# Patient Record
Sex: Male | Born: 1960 | Race: White | Hispanic: No | Marital: Single | Smoking: Current every day smoker
Health system: Southern US, Community
[De-identification: ages and names within clinical notes are randomized; demographics above are authoritative.]

## PROBLEM LIST (undated history)

## (undated) DIAGNOSIS — J449 Chronic obstructive pulmonary disease, unspecified: Secondary | ICD-10-CM

## (undated) DIAGNOSIS — F17200 Nicotine dependence, unspecified, uncomplicated: Secondary | ICD-10-CM

## (undated) DIAGNOSIS — I10 Essential (primary) hypertension: Secondary | ICD-10-CM

## (undated) HISTORY — DX: Essential (primary) hypertension: I10

## (undated) HISTORY — DX: Chronic obstructive pulmonary disease, unspecified: J44.9

## (undated) HISTORY — DX: Nicotine dependence, unspecified, uncomplicated: F17.200

---

## 2002-05-20 ENCOUNTER — Emergency Department (HOSPITAL_COMMUNITY): Admission: EM | Admit: 2002-05-20 | Discharge: 2002-05-20 | Payer: Self-pay | Admitting: Emergency Medicine

## 2011-05-06 ENCOUNTER — Emergency Department (HOSPITAL_COMMUNITY)
Admission: EM | Admit: 2011-05-06 | Discharge: 2011-05-06 | Disposition: A | Discharge status: Home / Self Care | Payer: Self-pay | Attending: Emergency Medicine | Admitting: Emergency Medicine

## 2011-05-06 ENCOUNTER — Other Ambulatory Visit: Payer: Self-pay

## 2011-05-06 ENCOUNTER — Encounter: Payer: Self-pay | Admitting: *Deleted

## 2011-05-06 ENCOUNTER — Emergency Department (HOSPITAL_COMMUNITY): Payer: Self-pay

## 2011-05-06 DIAGNOSIS — J4489 Other specified chronic obstructive pulmonary disease: Secondary | ICD-10-CM | POA: Insufficient documentation

## 2011-05-06 DIAGNOSIS — J449 Chronic obstructive pulmonary disease, unspecified: Secondary | ICD-10-CM | POA: Insufficient documentation

## 2011-05-06 DIAGNOSIS — R05 Cough: Secondary | ICD-10-CM | POA: Insufficient documentation

## 2011-05-06 DIAGNOSIS — J40 Bronchitis, not specified as acute or chronic: Secondary | ICD-10-CM | POA: Insufficient documentation

## 2011-05-06 DIAGNOSIS — R062 Wheezing: Secondary | ICD-10-CM | POA: Insufficient documentation

## 2011-05-06 DIAGNOSIS — F17200 Nicotine dependence, unspecified, uncomplicated: Secondary | ICD-10-CM | POA: Insufficient documentation

## 2011-05-06 DIAGNOSIS — R059 Cough, unspecified: Secondary | ICD-10-CM | POA: Insufficient documentation

## 2011-05-06 DIAGNOSIS — R079 Chest pain, unspecified: Secondary | ICD-10-CM | POA: Insufficient documentation

## 2011-05-06 DIAGNOSIS — R509 Fever, unspecified: Secondary | ICD-10-CM | POA: Insufficient documentation

## 2011-05-06 LAB — DIFFERENTIAL
Basophils Relative: 0 % (ref 0–1)
Eosinophils Absolute: 0 10*3/uL (ref 0.0–0.7)
Eosinophils Relative: 0 % (ref 0–5)
Lymphs Abs: 1.1 10*3/uL (ref 0.7–4.0)
Monocytes Relative: 13 % — ABNORMAL HIGH (ref 3–12)
Neutrophils Relative %: 76 % (ref 43–77)

## 2011-05-06 LAB — POCT I-STAT, CHEM 8
Calcium, Ion: 1.07 mmol/L — ABNORMAL LOW (ref 1.12–1.32)
Creatinine, Ser: 1 mg/dL (ref 0.50–1.35)
Glucose, Bld: 135 mg/dL — ABNORMAL HIGH (ref 70–99)
HCT: 47 % (ref 39.0–52.0)
Hemoglobin: 16 g/dL (ref 13.0–17.0)
Potassium: 4 mEq/L (ref 3.5–5.1)
TCO2: 26 mmol/L (ref 0–100)

## 2011-05-06 LAB — CBC
Hemoglobin: 15.6 g/dL (ref 13.0–17.0)
MCH: 31.6 pg (ref 26.0–34.0)
MCHC: 34.4 g/dL (ref 30.0–36.0)
MCV: 92.1 fL (ref 78.0–100.0)

## 2011-05-06 MED ORDER — ALBUTEROL SULFATE HFA 108 (90 BASE) MCG/ACT IN AERS: 1.0000 | INHALATION_SPRAY | Freq: Four times a day (QID) | RESPIRATORY_TRACT | Status: DC | PRN

## 2011-05-06 MED ORDER — IPRATROPIUM BROMIDE 0.02 % IN SOLN
0.5000 mg | Freq: Once | RESPIRATORY_TRACT | Status: DC
  Filled 2011-05-06: qty 2.5

## 2011-05-06 MED ORDER — METHYLPREDNISOLONE SODIUM SUCC 125 MG IJ SOLR
125.0000 mg | Freq: Once | INTRAMUSCULAR | Status: AC
  Administered 2011-05-06: 125 mg via INTRAVENOUS
  Filled 2011-05-06: qty 2

## 2011-05-06 MED ORDER — ALBUTEROL SULFATE (5 MG/ML) 0.5% IN NEBU: 5.0000 mg | INHALATION_SOLUTION | Freq: Once | RESPIRATORY_TRACT | Status: DC

## 2011-05-06 MED ORDER — IPRATROPIUM BROMIDE 0.02 % IN SOLN
0.5000 mg | Freq: Once | RESPIRATORY_TRACT | Status: AC
  Administered 2011-05-06: 0.5 mg via RESPIRATORY_TRACT

## 2011-05-06 MED ORDER — ALBUTEROL SULFATE (5 MG/ML) 0.5% IN NEBU
5.0000 mg | INHALATION_SOLUTION | Freq: Once | RESPIRATORY_TRACT | Status: AC
  Administered 2011-05-06: 5 mg via RESPIRATORY_TRACT
  Filled 2011-05-06: qty 1

## 2011-05-06 MED ORDER — DOXYCYCLINE HYCLATE 100 MG PO TABS
100.0000 mg | ORAL_TABLET | Freq: Once | ORAL | Status: AC
  Administered 2011-05-06: 100 mg via ORAL
  Filled 2011-05-06: qty 1

## 2011-05-06 MED ORDER — PREDNISONE 50 MG PO TABS: 50.0000 mg | ORAL_TABLET | Freq: Every day | ORAL | Status: DC

## 2011-05-06 MED ORDER — DOXYCYCLINE HYCLATE 100 MG PO CAPS: 100.0000 mg | ORAL_CAPSULE | Freq: Two times a day (BID) | ORAL | Status: AC

## 2011-05-06 MED ORDER — KETOROLAC TROMETHAMINE 30 MG/ML IJ SOLN
30.0000 mg | Freq: Once | INTRAMUSCULAR | Status: AC
  Administered 2011-05-06: 30 mg via INTRAVENOUS
  Filled 2011-05-06: qty 1

## 2011-05-06 NOTE — ED Notes (Signed)
Patient transported to X-ray. respiratiory called for breathing tx.

## 2011-05-06 NOTE — ED Provider Notes (Signed)
History     CSN: [id number] Arrival date & time: 05/06/2011 12:19 AM   First MD Initiated Contact with Patient 05/06/11 0119      Chief Complaint  Patient presents with  . Cough  . Chest Pain    (Consider location/radiation/quality/duration/timing/severity/associated sxs/prior treatment) Patient is a 50 y.o. male presenting with cough and chest pain. The history is provided by the patient. No language interpreter was used.  Cough The current episode started more than 2 days ago. The problem occurs constantly. The problem has been gradually worsening. The cough is productive of sputum (clear colorless sputum). The maximum temperature recorded prior to his arrival was 102 to 102.9 F. The fever has been present for 5 days or more. Associated symptoms include chest pain, chills and wheezing. Pertinent negatives include no weight loss, no ear congestion, no ear pain, no headaches and no rhinorrhea. He has tried nothing for the symptoms. The treatment provided no relief. Risk factors: sick contacts. He is a smoker. His past medical history is significant for asthma.  Chest Pain Primary symptoms include a fever, cough and wheezing. Pertinent negatives for primary symptoms include no dizziness.  The patient's medical history is significant for asthma.      History reviewed. No pertinent past medical history.  History reviewed. No pertinent past surgical history.  History reviewed. No pertinent family history.  History  Substance Use Topics  . Smoking status: Current Everyday Smoker -- 1.0 packs/day  . Smokeless tobacco: Not on file  . Alcohol Use: Yes      Review of Systems  Constitutional: Positive for fever and chills. Negative for weight loss.  HENT: Negative for ear pain, rhinorrhea, neck pain and neck stiffness.   Eyes: Negative for photophobia.  Respiratory: Positive for cough, chest tightness and wheezing.   Cardiovascular: Positive for chest pain.  Gastrointestinal:  Negative for abdominal distention.  Genitourinary: Negative for dysuria.  Musculoskeletal: Negative for arthralgias.  Skin: Negative.   Neurological: Negative for dizziness and headaches.  Hematological: Negative.   Psychiatric/Behavioral: Negative.     Allergies  Review of patient's allergies indicates no known allergies.  Home Medications   Current Outpatient Rx  Name Route Sig Dispense Refill  . ASPIRIN 81 MG PO CHEW Oral Chew 324 mg by mouth once.      Marland Kitchen PHENYLEPH-CPM-DM-APAP 09-25-08-325 MG PO CAPS Oral Take 2 tablets by mouth 2 (two) times daily as needed. For cold symptoms       BP 131/79  Pulse 107  Temp(Src) 100.7 F (38.2 C) (Oral)  Resp 22  SpO2 98%  Physical Exam  Constitutional: He is oriented to person, place, and time. He appears well-developed and well-nourished.  HENT:  Head: Normocephalic and atraumatic.  Eyes: EOM are normal. Pupils are equal, round, and reactive to light.  Neck: Normal range of motion. Neck supple.  Cardiovascular: Normal rate and regular rhythm.        Distant sounds  Pulmonary/Chest: He has wheezes.  Abdominal: Soft. Bowel sounds are normal. There is no tenderness. There is no rebound and no guarding.  Musculoskeletal: Normal range of motion. He exhibits no edema.  Neurological: He is alert and oriented to person, place, and time.  Skin: Skin is warm and dry. He is not diaphoretic.  Psychiatric: He has a normal mood and affect.    ED Course  Procedures (including critical care time)   Labs Reviewed  CBC  DIFFERENTIAL  I-STAT, CHEM 8  I-STAT TROPONIN I   No results  found.   No diagnosis found.   Results for orders placed during the hospital encounter of 05/06/11  CBC      Component Value Range   WBC 10.1  4.0 - 10.5 (K/uL)   RBC 4.93  4.22 - 5.81 (MIL/uL)   Hemoglobin 15.6  13.0 - 17.0 (g/dL)   HCT 16.1  09.6 - 04.5 (%)   MCV 92.1  78.0 - 100.0 (fL)   MCH 31.6  26.0 - 34.0 (pg)   MCHC 34.4  30.0 - 36.0 (g/dL)    RDW 40.9  81.1 - 91.4 (%)   Platelets 171  150 - 400 (K/uL)  DIFFERENTIAL      Component Value Range   Neutrophils Relative 76  43 - 77 (%)   Neutro Abs 7.7  1.7 - 7.7 (K/uL)   Lymphocytes Relative 11 (*) 12 - 46 (%)   Lymphs Abs 1.1  0.7 - 4.0 (K/uL)   Monocytes Relative 13 (*) 3 - 12 (%)   Monocytes Absolute 1.3 (*) 0.1 - 1.0 (K/uL)   Eosinophils Relative 0  0 - 5 (%)   Eosinophils Absolute 0.0  0.0 - 0.7 (K/uL)   Basophils Relative 0  0 - 1 (%)   Basophils Absolute 0.0  0.0 - 0.1 (K/uL)  POCT I-STAT, CHEM 8      Component Value Range   Sodium 141  135 - 145 (mEq/L)   Potassium 4.0  3.5 - 5.1 (mEq/L)   Chloride 103  96 - 112 (mEq/L)   BUN 15  6 - 23 (mg/dL)   Creatinine, Ser 7.82  0.50 - 1.35 (mg/dL)   Glucose, Bld 956 (*) 70 - 99 (mg/dL)   Calcium, Ion 2.13 (*) 1.12 - 1.32 (mmol/L)   TCO2 26  0 - 100 (mmol/L)   Hemoglobin 16.0  13.0 - 17.0 (g/dL)   HCT 08.6  57.8 - 46.9 (%)  POCT I-STAT TROPONIN I      Component Value Range   Troponin i, poc 0.01  0.00 - 0.08 (ng/mL)   Comment 3            Dg Chest 2 View  05/06/2011  *RADIOLOGY REPORT*  Clinical Data: Cough, fever, chest pressure.  CHEST - 2 VIEW  Comparison: None.  Findings: The heart size and pulmonary vascularity are normal. The lungs appear clear and expanded without focal air space disease or consolidation. No blunting of the costophrenic angles.  No pneumothorax.  Mild degenerative change in the thoracic spine.  IMPRESSION: No evidence of active pulmonary disease.  Original Report Authenticated By: Marlon Pel, M.D.    MDM   Date: 05/06/2011  Rate: 102  Rhythm: sinus tachycardia  QRS Axis: normal  Intervals: normal  ST/T Wave abnormalities: nonspecific T wave changes  Conduction Disutrbances:none  Narrative Interpretation:   Old EKG Reviewed: none available    feels markedly improved post toradol and nebs, states he feels like a new man.  Advised to quit smoking.  Take all medications as directed and  return for fevers, chills, worsening wheezing worsening cough, chest pain shortness of breath or any concerns.  Follow up with a family doctor.  Patient verbalizes understanding and agrees to follow up     Shellby Schlink Smitty Cords, MD 05/06/11 [phone]

## 2011-05-06 NOTE — ED Notes (Signed)
Pt ambulated with a steady gait; VSS; no acute signs of distress; respirations even and unlabored;no questions at this time.

## 2011-05-06 NOTE — ED Notes (Signed)
RT called for breathing tx on compressed air per MD.

## 2011-05-06 NOTE — ED Notes (Signed)
RT at bedside.

## 2011-05-06 NOTE — ED Notes (Signed)
Patient with c/o cough, throat pain, chest tightness from coughing.

## 2012-06-16 ENCOUNTER — Encounter (HOSPITAL_COMMUNITY): Payer: Self-pay | Admitting: *Deleted

## 2012-06-16 ENCOUNTER — Emergency Department (HOSPITAL_COMMUNITY)
Admission: EM | Admit: 2012-06-16 | Discharge: 2012-06-16 | Disposition: A | Discharge status: Home / Self Care | Payer: Self-pay | Attending: Emergency Medicine | Admitting: Emergency Medicine

## 2012-06-16 DIAGNOSIS — L0291 Cutaneous abscess, unspecified: Secondary | ICD-10-CM

## 2012-06-16 DIAGNOSIS — L02219 Cutaneous abscess of trunk, unspecified: Secondary | ICD-10-CM | POA: Insufficient documentation

## 2012-06-16 DIAGNOSIS — F17200 Nicotine dependence, unspecified, uncomplicated: Secondary | ICD-10-CM | POA: Insufficient documentation

## 2012-06-16 DIAGNOSIS — L723 Sebaceous cyst: Secondary | ICD-10-CM | POA: Insufficient documentation

## 2012-06-16 MED ORDER — OXYCODONE-ACETAMINOPHEN 5-325 MG PO TABS: 1.0000 | ORAL_TABLET | ORAL | Status: DC | PRN

## 2012-06-16 MED ORDER — SULFAMETHOXAZOLE-TRIMETHOPRIM 800-160 MG PO TABS: 1.0000 | ORAL_TABLET | Freq: Two times a day (BID) | ORAL | Status: DC

## 2012-06-16 MED ORDER — CEPHALEXIN 500 MG PO CAPS: 500.0000 mg | ORAL_CAPSULE | Freq: Four times a day (QID) | ORAL | Status: DC

## 2012-06-16 NOTE — ED Notes (Signed)
Pt states that he has a cyst on his back that "has been there for years" but reports that it has worsened. Pt states that girlfriend is able to get puss out at times. Pt noted to have reddened raised area to mid upper back.

## 2012-06-18 ENCOUNTER — Encounter (HOSPITAL_COMMUNITY): Payer: Self-pay | Admitting: *Deleted

## 2012-06-18 ENCOUNTER — Emergency Department (HOSPITAL_COMMUNITY)
Admission: EM | Admit: 2012-06-18 | Discharge: 2012-06-18 | Disposition: A | Discharge status: Home / Self Care | Payer: Self-pay | Attending: Emergency Medicine | Admitting: Emergency Medicine

## 2012-06-18 DIAGNOSIS — L03319 Cellulitis of trunk, unspecified: Secondary | ICD-10-CM | POA: Insufficient documentation

## 2012-06-18 DIAGNOSIS — F17200 Nicotine dependence, unspecified, uncomplicated: Secondary | ICD-10-CM | POA: Insufficient documentation

## 2012-06-18 DIAGNOSIS — L02219 Cutaneous abscess of trunk, unspecified: Secondary | ICD-10-CM | POA: Insufficient documentation

## 2012-06-18 DIAGNOSIS — L02212 Cutaneous abscess of back [any part, except buttock]: Secondary | ICD-10-CM

## 2012-06-18 NOTE — ED Provider Notes (Signed)
History     CSN: [id number]  Arrival date & time 06/18/12  6644   First MD Initiated Contact with Patient 06/18/12 [phone]      Chief Complaint  Patient presents with  . Wound Check    (Consider location/radiation/quality/duration/timing/severity/associated sxs/prior treatment) HPI  52 year old male presents for an abscess recheck.  Pt sts he has an area of swelling and redness to his back which was I&D 2 days ago in the ER.  It was an infected epidermoid cyst.  Moderate packing were placed.  Pt reports noticing redness to skin but denies increasing pain or fever.  No rash.  Has been taking his abx and pain medication.  No hx of diabetes.  Current smoker.    History reviewed. No pertinent past medical history.  History reviewed. No pertinent past surgical history.  History reviewed. No pertinent family history.  History  Substance Use Topics  . Smoking status: Current Every Day Smoker -- 1.0 packs/day  . Smokeless tobacco: Not on file  . Alcohol Use: Yes      Review of Systems  Constitutional: Negative for fever.  Musculoskeletal: Positive for back pain.  Neurological: Negative for numbness.    Allergies  Review of patient's allergies indicates no known allergies.  Home Medications   Current Outpatient Rx  Name  Route  Sig  Dispense  Refill  . CEPHALEXIN 500 MG PO CAPS   Oral   Take 1 capsule (500 mg total) by mouth 4 (four) times daily.   28 capsule   0   . OXYCODONE-ACETAMINOPHEN 5-325 MG PO TABS   Oral   Take 1-2 tablets by mouth every 4 (four) hours as needed for pain.   30 tablet   0   . SULFAMETHOXAZOLE-TRIMETHOPRIM 800-160 MG PO TABS   Oral   Take 1 tablet by mouth 2 (two) times daily.   14 tablet   0     BP 135/86  Pulse 96  Temp 98.6 F (37 C) (Oral)  Resp 16  SpO2 97%  Physical Exam  Nursing note and vitals reviewed. Constitutional: He is oriented to person, place, and time. He appears well-developed and well-nourished. No distress.         obese  HENT:  Head: Atraumatic.  Neck: Neck supple.  Musculoskeletal: Normal range of motion.  Neurological: He is alert and oriented to person, place, and time.  Skin: Skin is warm.       Left upper back with site of prior abscess with packing in placed.  Mild erythema noted to surrounding skin.  Mild induration without obvious fluctuance.  ttp.    ED Course  Procedures (including critical care time)  Labs Reviewed - No data to display No results found.   No diagnosis found.  1. Abscess recheck  MDM  Pt is here for wound recheck.  Packing removed from site.  Dressing placed.  Pt has mild skin erythema, which i marked and dated.  Care instruction including changing dressing and return precaution.  Pt voice understanding and agrees with plan.    BP 135/86  Pulse 96  Temp 98.6 F (37 C) (Oral)  Resp 16  SpO2 97%        Fayrene Helper, PA-C 06/18/12 [phone]

## 2012-06-18 NOTE — ED Notes (Signed)
To ED for re-eval of possible back abscess that was drained on Monday. Pt states he was told to come for recheck. Dressing remains. Pt concerned about continued redness to right side of wound.

## 2012-06-18 NOTE — ED Provider Notes (Signed)
Medical screening examination/treatment/procedure(s) were performed by non-physician practitioner and as supervising physician I was immediately available for consultation/collaboration.   Carleene Cooper III, MD 06/18/12 2029

## 2012-06-21 NOTE — ED Provider Notes (Signed)
History     CSN: [id number]  Arrival date & time 06/16/12  4098   First MD Initiated Contact with Patient 06/16/12 [phone]      Chief Complaint  Patient presents with  . Recurrent Skin Infections    (Consider location/radiation/quality/duration/timing/severity/associated sxs/prior treatment) HPI Patient with c/o tender, swollen cyst in right upper back.  Patien has had the cyst fr many years. Recently the cyst has become very painful and hot with surrounding tissue tenderness, warmth, heat and erythema.  Denies fevers, chills, myalgias, arthralgias. Denies DOE, SOB, chest tightness or pressure, radiation to left arm, jaw or back, or diaphoresis. Denies dysuria, flank pain, suprapubic pain, frequency, urgency, or hematuria. Denies headaches, light headedness, weakness, visual disturbances. Denies abdominal pain, nausea, vomiting, diarrhea or constipation.   History reviewed. No pertinent past medical history.  History reviewed. No pertinent past surgical history.  No family history on file.  History  Substance Use Topics  . Smoking status: Current Every Day Smoker -- 1.0 packs/day  . Smokeless tobacco: Not on file  . Alcohol Use: Yes      Review of Systems Ten systems reviewed and are negative for acute change, except as noted in the HPI.   Allergies  Review of patient's allergies indicates no known allergies.  Home Medications   Current Outpatient Rx  Name  Route  Sig  Dispense  Refill  . CEPHALEXIN 500 MG PO CAPS   Oral   Take 1 capsule (500 mg total) by mouth 4 (four) times daily.   28 capsule   0   . OXYCODONE-ACETAMINOPHEN 5-325 MG PO TABS   Oral   Take 1-2 tablets by mouth every 4 (four) hours as needed for pain.   30 tablet   0   . SULFAMETHOXAZOLE-TRIMETHOPRIM 800-160 MG PO TABS   Oral   Take 1 tablet by mouth 2 (two) times daily.   14 tablet   0     BP 147/84  Pulse 105  Temp 98.4 F (36.9 C) (Oral)  Resp 20  SpO2 99%  Physical Exam    Nursing note and vitals reviewed. Constitutional: He appears well-developed and well-nourished. No distress.  HENT:  Head: Normocephalic and atraumatic.  Eyes: Conjunctivae normal are normal. No scleral icterus.  Neck: Normal range of motion. Neck supple.  Cardiovascular: Normal rate, regular rhythm and normal heart sounds.   Pulmonary/Chest: Effort normal and breath sounds normal. No respiratory distress.  Abdominal: Soft. There is no tenderness.  Musculoskeletal: He exhibits no edema.  Neurological: He is alert.  Skin: Skin is warm and dry. He is not diaphoretic.     Psychiatric: His behavior is normal.    ED Course  Procedures (including critical care time) INCISION AND DRAINAGE Performed by: Arthor Captain Consent: Verbal consent obtained. Risks and benefits: risks, benefits and alternatives were discussed  Sterile Prep and Drape  Type: abscess  Body area: back  Anesthesia: local  Local anesthetic: lidocaine 2 % with epinephrine  Anesthetic total: 5 ml  Incision: 11 Blade  Complexity: complex Blunt dissection to breakup loculations Cyst wall excision  Drainage content: purulent and sebaceous,  Drainage amount: copious   Flushedvigourously with copious amount of sterile saline Packing: 1/4" iodoform gauze  Patient tolerance: Patient tolerated the procedure well with no immediate complications.  Labs Reviewed - No data to display No results found.   1. Cellulitis and abscess   2. Sebaceous cyst       MDM  Patient with skin abscess from infected  sebacous cyst amenable to incision and drainage.  Abscess was flushed and packed,  wound recheck in 2 days.   Signs of cellulitis in surrounding skin.  Will d/c to home with pain meds and antibiotic therapy as indicated.         Arthor Captain, PA-C 06/21/12 [phone]

## 2012-06-21 NOTE — ED Provider Notes (Signed)
Medical screening examination/treatment/procedure(s) were performed by non-physician practitioner and as supervising physician I was immediately available for consultation/collaboration.  Derwood Kaplan, MD 06/21/12 1554

## 2012-11-30 ENCOUNTER — Ambulatory Visit: Payer: Self-pay | Attending: Family Medicine | Admitting: Family Medicine

## 2012-11-30 VITALS — BP 158/78 | HR 99 | Temp 99.1°F | Resp 18 | Ht 69.0 in | Wt 272.2 lb

## 2012-11-30 DIAGNOSIS — I10 Essential (primary) hypertension: Secondary | ICD-10-CM

## 2012-11-30 DIAGNOSIS — F17200 Nicotine dependence, unspecified, uncomplicated: Secondary | ICD-10-CM | POA: Insufficient documentation

## 2012-11-30 DIAGNOSIS — J449 Chronic obstructive pulmonary disease, unspecified: Secondary | ICD-10-CM | POA: Insufficient documentation

## 2012-11-30 HISTORY — DX: Essential (primary) hypertension: I10

## 2012-11-30 MED ORDER — IPRATROPIUM-ALBUTEROL 0.5-2.5 (3) MG/3ML IN SOLN
3.0000 mL | Freq: Once | RESPIRATORY_TRACT | Status: AC
  Administered 2012-11-30: 3 mL via RESPIRATORY_TRACT

## 2012-11-30 MED ORDER — ALBUTEROL SULFATE HFA 108 (90 BASE) MCG/ACT IN AERS: 2.0000 | INHALATION_SPRAY | RESPIRATORY_TRACT | Status: DC | PRN

## 2012-11-30 NOTE — Progress Notes (Signed)
77/14 Present to clinic for breathing check. No SOB noted. Patient ststes he feel fine. P.Arriyana Rodell,RN BSN  [facility]

## 2012-11-30 NOTE — Patient Instructions (Addendum)
Smoking Cessation, Tips for Success  YOU CAN QUIT SMOKING  If you are ready to quit smoking, congratulations! You have chosen to help yourself be healthier. Cigarettes bring nicotine, tar, carbon monoxide, and other irritants into your body. Your lungs, heart, and blood vessels will be able to work better without these poisons. There are many different ways to quit smoking. Nicotine gum, nicotine patches, a nicotine inhaler, or nicotine nasal spray can help with physical craving. Hypnosis, support groups, and medicines help break the habit of smoking. Here are some tips to help you quit for good.   Throw away all cigarettes.   Clean and remove all ashtrays from your home, work, and car.   On a card, write down your reasons for quitting. Carry the card with you and read it when you get the urge to smoke.   Cleanse your body of nicotine. Drink enough water and fluids to keep your urine clear or pale yellow. Do this after quitting to flush the nicotine from your body.   Learn to predict your moods. Do not let a bad situation be your excuse to have a cigarette. Some situations in your life might tempt you into wanting a cigarette.   Never have "just one" cigarette. It leads to wanting another and another. Remind yourself of your decision to quit.   Change habits associated with smoking. If you smoked while driving or when feeling stressed, try other activities to replace smoking. Stand up when drinking your coffee. Brush your teeth after eating. Sit in a different chair when you read the paper. Avoid alcohol while trying to quit, and try to drink fewer caffeinated beverages. Alcohol and caffeine may urge you to smoke.   Avoid foods and drinks that can trigger a desire to smoke, such as sugary or spicy foods and alcohol.   Ask people who smoke not to smoke around you.    Have something planned to do right after eating or having a cup of coffee. Take a walk or exercise to perk you up. This will help to keep you from overeating.   Try a relaxation exercise to calm you down and decrease your stress. Remember, you may be tense and nervous for the first 2 weeks after you quit, but this will pass.   Find new activities to keep your hands busy. Play with a pen, coin, or rubber band. Doodle or draw things on paper.   Brush your teeth right after eating. This will help cut down on the craving for the taste of tobacco after meals. You can try mouthwash, too.   Use oral substitutes, such as lemon drops, carrots, a cinnamon stick, or chewing gum, in place of cigarettes. Keep them handy so they are available when you have the urge to smoke.   When you have the urge to smoke, try deep breathing.   Designate your home as a nonsmoking area.   If you are a heavy smoker, ask your caregiver about a prescription for nicotine chewing gum. It can ease your withdrawal from nicotine.   Reward yourself. Set aside the cigarette money you save and buy yourself something nice.   Look for support from others. Join a support group or smoking cessation program. Ask someone at home or at work to help you with your plan to quit smoking.   Always ask yourself, "Do I need this cigarette or is this just a reflex?" Tell yourself, "Today, I choose not to smoke," or "I do not want to   smoke." You are reminding yourself of your decision to quit, even if you do smoke a cigarette.  HOW WILL I FEEL WHEN I QUIT SMOKING?   The benefits of not smoking start within days of quitting.   You may have symptoms of withdrawal because your body is used to nicotine (the addictive substance in cigarettes). You may crave cigarettes, be irritable, feel very hungry, cough often, get headaches, or have difficulty concentrating.    The withdrawal symptoms are only temporary. They are strongest when you first quit but will go away within 10 to 14 days.   When withdrawal symptoms occur, stay in control. Think about your reasons for quitting. Remind yourself that these are signs that your body is healing and getting used to being without cigarettes.   Remember that withdrawal symptoms are easier to treat than the major diseases that smoking can cause.   Even after the withdrawal is over, expect periodic urges to smoke. However, these cravings are generally short-lived and will go away whether you smoke or not. Do not smoke!   If you relapse and smoke again, do not lose hope. Most smokers quit 3 times before they are successful.   If you relapse, do not give up! Plan ahead and think about what you will do the next time you get the urge to smoke.  LIFE AS A NONSMOKER: MAKE IT FOR A MONTH, MAKE IT FOR LIFE  Day 1: Hang this page where you will see it every day.  Day 2: Get rid of all ashtrays, matches, and lighters.  Day 3: Drink water. Breathe deeply between sips.  Day 4: Avoid places with smoke-filled air, such as bars, clubs, or the smoking section of restaurants.  Day 5: Keep track of how much money you save by not smoking.  Day 6: Avoid boredom. Keep a good book with you or go to the movies.  Day 7: Reward yourself! One week without smoking!  Day 8: Make a dental appointment to get your teeth cleaned.  Day 9: Decide how you will turn down a cigarette before it is offered to you.  Day 10: Review your reasons for quitting.  Day 11: Distract yourself. Stay active to keep your mind off smoking and to relieve tension. Take a walk, exercise, read a book, do a crossword puzzle, or try a new hobby.  Day 12: Exercise. Get off the bus before your stop or use stairs instead of escalators.  Day 13: Call on friends for support and encouragement.  Day 14: Reward yourself! Two weeks without smoking!  Day 15: Practice deep breathing exercises.   Day 16: Bet a friend that you can stay a nonsmoker.  Day 17: Ask to sit in nonsmoking sections of restaurants.  Day 18: Hang up "No Smoking" signs.  Day 19: Think of yourself as a nonsmoker.  Day 20: Each morning, tell yourself you will not smoke.  Day 21: Reward yourself! Three weeks without smoking!  Day 22: Think of smoking in negative ways. Remember how it stains your teeth, gives you bad breath, and leaves you short of breath.  Day 23: Eat a nutritious breakfast.  Day 24:Do not relive your days as a smoker.  Day 25: Hold a pencil in your hand when talking on the telephone.  Day 26: Tell all your friends you do not smoke.  Day 27: Think about how much better food tastes.  Day 28: Remember, one cigarette is one too many.  Day 29: Take up   a hobby that will keep your hands busy.  Day 30: Congratulations! One month without smoking! Give yourself a big reward.  Your caregiver can direct you to community resources or hospitals for support, which may include:   Group support.   Education.   Hypnosis.   Subliminal therapy.  Document Released: 02/09/2004 Document Revised: 08/05/2011 Document Reviewed: 02/27/2009  [employer] Patient Information 2014 [employer]  Smoking Cessation  Quitting smoking is important to your health and has many advantages. However, it is not always easy to quit since nicotine is a very addictive drug. Often times, people try 3 times or more before being able to quit. This document explains the best ways for you to prepare to quit smoking. Quitting takes hard work and a lot of effort, but you can do it.  ADVANTAGES OF QUITTING SMOKING   You will live longer, feel better, and live better.   Your body will feel the impact of quitting smoking almost immediately.   Within 20 minutes, blood pressure decreases. Your pulse returns to its normal level.   After 8 hours, carbon monoxide levels in the blood return to normal. Your oxygen level increases.    After 24 hours, the chance of having a heart attack starts to decrease. Your breath, hair, and body stop smelling like smoke.   After 48 hours, damaged nerve endings begin to recover. Your sense of taste and smell improve.   After 72 hours, the body is virtually free of nicotine. Your bronchial tubes relax and breathing becomes easier.   After 2 to 12 weeks, lungs can hold more air. Exercise becomes easier and circulation improves.   The risk of having a heart attack, stroke, cancer, or lung disease is greatly reduced.   After 1 year, the risk of coronary heart disease is cut in half.   After 5 years, the risk of stroke falls to the same as a nonsmoker.   After 10 years, the risk of lung cancer is cut in half and the risk of other cancers decreases significantly.   After 15 years, the risk of coronary heart disease drops, usually to the level of a nonsmoker.   If you are pregnant, quitting smoking will improve your chances of having a healthy baby.   The people you live with, especially any children, will be healthier.   You will have extra money to spend on things other than cigarettes.  QUESTIONS TO THINK ABOUT BEFORE ATTEMPTING TO QUIT  You may want to talk about your answers with your caregiver.   Why do you want to quit?   If you tried to quit in the past, what helped and what did not?   What will be the most difficult situations for you after you quit? How will you plan to handle them?   Who can help you through the tough times? Your family? Friends? A caregiver?   What pleasures do you get from smoking? What ways can you still get pleasure if you quit?  Here are some questions to ask your caregiver:   How can you help me to be successful at quitting?   What medicine do you think would be best for me and how should I take it?   What should I do if I need more help?   What is smoking withdrawal like? How can I get information on withdrawal?  GET READY   Set a quit date.    Change your environment by getting rid of all cigarettes, ashtrays,   matches, and lighters in your home, car, or work. Do not let people smoke in your home.   Review your past attempts to quit. Think about what worked and what did not.  GET SUPPORT AND ENCOURAGEMENT  You have a better chance of being successful if you have help. You can get support in many ways.   Tell your family, friends, and co-workers that you are going to quit and need their support. Ask them not to smoke around you.   Get individual, group, or telephone counseling and support. Programs are available at local hospitals and health centers. Call your local health department for information about programs in your area.   Spiritual beliefs and practices may help some smokers quit.   Download a "quit meter" on your computer to keep track of quit statistics, such as how long you have gone without smoking, cigarettes not smoked, and money saved.   Get a self-help book about quitting smoking and staying off of tobacco.  LEARN NEW SKILLS AND BEHAVIORS   Distract yourself from urges to smoke. Talk to someone, go for a walk, or occupy your time with a task.   Change your normal routine. Take a different route to work. Drink tea instead of coffee. Eat breakfast in a different place.   Reduce your stress. Take a hot bath, exercise, or read a book.   Plan something enjoyable to do every day. Reward yourself for not smoking.   Explore interactive web-based programs that specialize in helping you quit.  GET MEDICINE AND USE IT CORRECTLY  Medicines can help you stop smoking and decrease the urge to smoke. Combining medicine with the above behavioral methods and support can greatly increase your chances of successfully quitting smoking.    Nicotine replacement therapy helps deliver nicotine to your body without the negative effects and risks of smoking. Nicotine replacement therapy includes nicotine gum, lozenges, inhalers, nasal sprays, and skin patches. Some may be available over-the-counter and others require a prescription.   Antidepressant medicine helps people abstain from smoking, but how this works is unknown. This medicine is available by prescription.   Nicotinic receptor partial agonist medicine simulates the effect of nicotine in your brain. This medicine is available by prescription.  Ask your caregiver for advice about which medicines to use and how to use them based on your health history. Your caregiver will tell you what side effects to look out for if you choose to be on a medicine or therapy. Carefully read the information on the package. Do not use any other product containing nicotine while using a nicotine replacement product.   RELAPSE OR DIFFICULT SITUATIONS  Most relapses occur within the first 3 months after quitting. Do not be discouraged if you start smoking again. Remember, most people try several times before finally quitting. You may have symptoms of withdrawal because your body is used to nicotine. You may crave cigarettes, be irritable, feel very hungry, cough often, get headaches, or have difficulty concentrating. The withdrawal symptoms are only temporary. They are strongest when you first quit, but they will go away within 10 14 days.  To reduce the chances of relapse, try to:   Avoid drinking alcohol. Drinking lowers your chances of successfully quitting.   Reduce the amount of caffeine you consume. Once you quit smoking, the amount of caffeine in your body increases and can give you symptoms, such as a rapid heartbeat, sweating, and anxiety.   Avoid smokers because they can make you want to

## 2012-11-30 NOTE — Progress Notes (Signed)
Patient ID: John Gillespie, male   DOB: Jan 13, 1961, 52 y.o.   MRN: [id number]  CC: breathing  HPI: Pt says that he wheezes all the time.  He is a smoker.  He would like to have his lungs checked for COPD.    No Known Allergies Past Medical History  Diagnosis Date  . Smoker   . COPD (chronic obstructive pulmonary disease)   . Unspecified essential hypertension 11/30/2012   Current Outpatient Prescriptions on File Prior to Visit  Medication Sig Dispense Refill  . cephALEXin (KEFLEX) 500 MG capsule Take 1 capsule (500 mg total) by mouth 4 (four) times daily.  28 capsule  0  . oxyCODONE-acetaminophen (PERCOCET) 5-325 MG per tablet Take 1-2 tablets by mouth every 4 (four) hours as needed for pain.  30 tablet  0  . sulfamethoxazole-trimethoprim (BACTRIM DS,SEPTRA DS) 800-160 MG per tablet Take 1 tablet by mouth 2 (two) times daily.  14 tablet  0   No current facility-administered medications on file prior to visit.   Family History  Problem Relation Age of Onset  . COPD    . Cancer    . Cancer Father    History   Social History  . Marital Status: Single    Spouse Name: N/A    Number of Children: N/A  . Years of Education: N/A   Occupational History  . Steel and Database administrator  Unemployed   Social History Main Topics  . Smoking status: Current Every Day Smoker -- 1.00 packs/day  . Smokeless tobacco: Not on file  . Alcohol Use: Yes  . Drug Use: No     Comment: used in past   . Sexually Active: No   Other Topics Concern  . Not on file   Social History Narrative  . No narrative on file    Review of Systems  Constitutional: Negative for fever, chills, diaphoresis, activity change, appetite change and fatigue.  HENT: Negative for ear pain, nosebleeds, congestion, facial swelling, rhinorrhea, neck pain, neck stiffness and ear discharge.   Eyes: Negative for pain, discharge, redness, itching and visual disturbance.  Respiratory: Negative for cough, choking,  chest tightness, shortness of breath, wheezing and stridor.   Cardiovascular: Negative for chest pain, palpitations and leg swelling.  Gastrointestinal: Negative for abdominal distention.  Genitourinary: Negative for dysuria, urgency, frequency, hematuria, flank pain, decreased urine volume, difficulty urinating and dyspareunia.  Musculoskeletal: Negative for back pain, joint swelling, arthralgias and gait problem.  Neurological: Negative for dizziness, tremors, seizures, syncope, facial asymmetry, speech difficulty, weakness, light-headedness, numbness and headaches.  Hematological: Negative for adenopathy. Does not bruise/bleed easily.  Psychiatric/Behavioral: Negative for hallucinations, behavioral problems, confusion, dysphoric mood, decreased concentration and agitation.    Objective:   Filed Vitals:   11/30/12 1121  BP: 158/78  Pulse: 99  Temp: 99.1 F (37.3 C)  Resp: 18    Physical Exam  Constitutional: Appears well-developed and well-nourished. No distress.  HENT: Normocephalic. External right and left ear normal. Oropharynx is clear and moist.  Eyes: Conjunctivae and EOM are normal. PERRLA, no scleral icterus.  Neck: Normal ROM. Neck supple. No JVD. No tracheal deviation. No thyromegaly.  CVS: RRR, S1/S2 +, no murmurs, no gallops, no carotid bruit.  Pulmonary: Effort and breath sounds normal, no stridor, rhonchi, wheezes, rales.  Abdominal: Soft. BS +,  no distension, tenderness, rebound or guarding.  Musculoskeletal: Normal range of motion. No edema and no tenderness.  Lymphadenopathy: No lymphadenopathy noted, cervical, inguinal. Neuro: Alert. Normal reflexes,  muscle tone coordination. No cranial nerve deficit. Skin: Skin is warm and dry. No rash noted. Not diaphoretic. No erythema. No pallor.  Psychiatric: Normal mood and affect. Behavior, judgment, thought content normal.   Lab Results  Component Value Date   WBC 10.1 05/06/2011   HGB 16.0 05/06/2011   HCT 47.0  05/06/2011   MCV 92.1 05/06/2011   PLT 171 05/06/2011   Lab Results  Component Value Date   CREATININE 1.00 05/06/2011   BUN 15 05/06/2011   NA 141 05/06/2011   K 4.0 05/06/2011   CL 103 05/06/2011    No results found for this basename: HGBA1C   Lipid Panel  No results found for this basename: chol, trig, hdl, cholhdl, vldl, ldlcalc      Assessment and plan:   Patient Active Problem List   Diagnosis Date Noted  . COPD (chronic obstructive pulmonary disease) 11/30/2012  . Unspecified essential hypertension 11/30/2012  . Smoker 11/30/2012   The patient was counseled on the dangers of tobacco use, and was advised to quit.  Reviewed strategies to maximize success, including removing cigarettes and smoking materials from environment.  Neb treatment  Given in office and pt had significant better movement of air.    Pt was scheduled for sp[facility]rometry with [facility].    Sample given for albuterol inhaler   RTC in 3 months  Rodney Langton, MD, CDE, FAAFP [facility] [place], [place]

## 2012-12-01 ENCOUNTER — Ambulatory Visit: Payer: Self-pay | Admitting: Pharmacist

## 2012-12-08 ENCOUNTER — Ambulatory Visit: Payer: Self-pay

## 2012-12-10 ENCOUNTER — Ambulatory Visit: Payer: Self-pay | Admitting: Pharmacist

## 2013-03-23 ENCOUNTER — Emergency Department (HOSPITAL_COMMUNITY)
Admission: EM | Admit: 2013-03-23 | Discharge: 2013-03-23 | Disposition: A | Discharge status: Home / Self Care | Payer: Self-pay | Attending: Emergency Medicine | Admitting: Emergency Medicine

## 2013-03-23 ENCOUNTER — Encounter (HOSPITAL_COMMUNITY): Payer: Self-pay | Admitting: Emergency Medicine

## 2013-03-23 DIAGNOSIS — Z79899 Other long term (current) drug therapy: Secondary | ICD-10-CM | POA: Insufficient documentation

## 2013-03-23 DIAGNOSIS — Y9389 Activity, other specified: Secondary | ICD-10-CM | POA: Insufficient documentation

## 2013-03-23 DIAGNOSIS — J4489 Other specified chronic obstructive pulmonary disease: Secondary | ICD-10-CM | POA: Insufficient documentation

## 2013-03-23 DIAGNOSIS — S81009A Unspecified open wound, unspecified knee, initial encounter: Secondary | ICD-10-CM | POA: Insufficient documentation

## 2013-03-23 DIAGNOSIS — Y9289 Other specified places as the place of occurrence of the external cause: Secondary | ICD-10-CM | POA: Insufficient documentation

## 2013-03-23 DIAGNOSIS — J449 Chronic obstructive pulmonary disease, unspecified: Secondary | ICD-10-CM | POA: Insufficient documentation

## 2013-03-23 DIAGNOSIS — Z23 Encounter for immunization: Secondary | ICD-10-CM | POA: Insufficient documentation

## 2013-03-23 DIAGNOSIS — I10 Essential (primary) hypertension: Secondary | ICD-10-CM | POA: Insufficient documentation

## 2013-03-23 DIAGNOSIS — IMO0002 Reserved for concepts with insufficient information to code with codable children: Secondary | ICD-10-CM | POA: Insufficient documentation

## 2013-03-23 DIAGNOSIS — S81811A Laceration without foreign body, right lower leg, initial encounter: Secondary | ICD-10-CM

## 2013-03-23 DIAGNOSIS — F17200 Nicotine dependence, unspecified, uncomplicated: Secondary | ICD-10-CM | POA: Insufficient documentation

## 2013-03-23 MED ORDER — TETANUS-DIPHTH-ACELL PERTUSSIS 5-2.5-18.5 LF-MCG/0.5 IM SUSP
0.5000 mL | Freq: Once | INTRAMUSCULAR | Status: AC
  Administered 2013-03-23: 0.5 mL via INTRAMUSCULAR
  Filled 2013-03-23: qty 0.5

## 2013-03-23 MED ORDER — HYDROCODONE-ACETAMINOPHEN 5-325 MG PO TABS
2.0000 | ORAL_TABLET | Freq: Once | ORAL | Status: AC
  Administered 2013-03-23: 2 via ORAL
  Filled 2013-03-23: qty 2

## 2013-03-23 NOTE — ED Provider Notes (Signed)
CSN: [id number]     Arrival date & time 03/23/13  1638 History  This chart was scribed for non-physician practitioner Dierdre Forth, PA-C, working with Enid Skeens, MD by Dorothey Baseman, ED Scribe. This patient was seen in room TR11C/TR11C and the patient's care was started at 7:00 PM.    Chief Complaint  Patient presents with  . Extremity Laceration   Patient is a 52 y.o. male presenting with skin laceration. The history is provided by the patient and medical records. No language interpreter was used.  Laceration Location:  Leg Leg laceration location:  R lower leg Length (cm):  12 Bleeding: controlled with pressure   Injury mechanism: wood. Pain details:    Quality:  Throbbing   Severity:  Moderate   Timing:  Constant Worsened by:  Movement and pressure Tetanus status:  Out of date  HPI Comments: John Gillespie is a 52 y.o. male who presents to the Emergency Department complaining of a laceration to the right lateral, lower leg that occurred around 3-4 hours ago when the patient states that he stepped into a hole in a trailer and was cut by a piece of wood. Patient expresses that he does not believe there is anything retained in the wound. He states that blood was spurting out of the area, but that it resolved quickly. A pressure dressing was applied and the bleeding is well-controlled at this time. He reports an associated, constant, throbbing pain to the area that is exacerbated with pressure and movement. He denies any dizziness or weakness. Patient reports that his tetanus is not up to date. Patient reports a history of COPD. He denies any other pertinent medical history.   Past Medical History  Diagnosis Date  . Smoker   . COPD (chronic obstructive pulmonary disease)   . Unspecified essential hypertension 11/30/2012   History reviewed. No pertinent past surgical history. Family History  Problem Relation Age of Onset  . COPD    . Cancer    . Cancer Father    History   Substance Use Topics  . Smoking status: Current Every Day Smoker -- 1.00 packs/day  . Smokeless tobacco: Not on file  . Alcohol Use: Yes    Review of Systems  Constitutional: Negative for fever.  Gastrointestinal: Negative for nausea and vomiting.  Musculoskeletal: Positive for myalgias.  Skin: Positive for wound (laceration ).  Allergic/Immunologic: Negative for immunocompromised state.  Neurological: Negative for dizziness, weakness and numbness.  Hematological: Does not bruise/bleed easily.  Psychiatric/Behavioral: The patient is not nervous/anxious.    Allergies  Codeine  Home Medications   Current Outpatient Rx  Name  Route  Sig  Dispense  Refill  . albuterol (PROVENTIL HFA;VENTOLIN HFA) 108 (90 BASE) MCG/ACT inhaler   Inhalation   Inhale 2 puffs into the lungs every 4 (four) hours as needed for wheezing or shortness of breath (cough, shortness of breath or wheezing.).   1 Inhaler   1    Triage Vitals: BP 155/85  Pulse 92  Temp(Src) 99.1 F (37.3 C) (Oral)  Resp 16  SpO2 97%  Physical Exam  Nursing note and vitals reviewed. Constitutional: He is oriented to person, place, and time. He appears well-developed and well-nourished. No distress.  HENT:  Head: Normocephalic and atraumatic.  Eyes: Conjunctivae are normal. No scleral icterus.  Neck: Normal range of motion.  Cardiovascular: Intact distal pulses.   No murmur heard. Capillary refill < 3 sec  Pulmonary/Chest: Effort normal. No respiratory distress.  Musculoskeletal: Normal  range of motion. He exhibits no edema.  ROM: normal  Neurological: He is alert and oriented to person, place, and time.  Sensation: normal Strength: normal  Skin: Skin is warm and dry. He is not diaphoretic.  12 cm laceration with 2 cm gaping to the posterior right calf. 16 cm superficial laceration up the back of the right calf that is non-suturable.   Psychiatric: He has a normal mood and affect.    ED Course  Procedures  (including critical care time)  DIAGNOSTIC STUDIES: Oxygen Saturation is 97% on room air, normal by my interpretation.    COORDINATION OF CARE: 7:03 PM- Will order pain medication and repair the laceration with sutures. Will order a tetanus vaccination. Discussed treatment plan with patient at bedside and patient verbalized agreement.   LACERATION REPAIR PROCEDURE NOTE The patient's identification was confirmed and consent was obtained. This procedure was performed by Dahlia Client Abreanna Drawdy at 7:15 PM. Site: posterior right calf Sterile procedures observed Anesthetic used (type and amt): 2% lidocaine with epinephrine, 8 cc Suture type/size: 3.0 Prolene Length: 12 cm # of Sutures: 7 Technique: horizontal mattress  Complexity: complex Antibx ointment applied Tetanus ordered Site anesthetized, irrigated with NS, explored without evidence of foreign body, wound well approximated, site covered with dry, sterile dressing.  Patient tolerated procedure well without complications. Instructions for care discussed verbally and patient provided with additional written instructions for homecare and f/u.   Labs Review Labs Reviewed - No data to display Imaging Review No results found.  EKG Interpretation   None       MDM   1. Laceration of calf without complication, right, initial encounter   2. Need for diphtheria-tetanus-pertussis (Tdap) vaccine, adult/adolescent      John Gillespie presents with laceration to right lower calf.  Tdap booster given. Pressure irrigation performed and wound explored without foreign body. Laceration occurred < 8 hours prior to repair which was well tolerated. Pt has no co morbidities to effect normal wound healing. Discussed suture home care w pt and answered questions. Pt to f-u for wound check and suture removal in 7 days. Pt is hemodynamically stable w no complaints prior to dc.    It has been determined that no acute conditions requiring further  emergency intervention are present at this time. The patient/guardian have been advised of the diagnosis and plan. We have discussed signs and symptoms that warrant return to the ED, such as changes or worsening in symptoms.   Vital signs are stable at discharge.   BP 125/83  Pulse 87  Temp(Src) 98.9 F (37.2 C) (Oral)  Resp 16  SpO2 96%  Patient/guardian has voiced understanding and agreed to follow-up with the PCP or specialist.    I personally performed the services described in this documentation, which was scribed in my presence. The recorded information has been reviewed and is accurate.    Dierdre Forth, PA-C 03/23/13 1940

## 2013-03-23 NOTE — ED Notes (Signed)
Presents with laceration to right lateral leg from a trailer that he stepped in and lacerated aprrox 3-4 inches, cms intact, bleeding is controlled now. Pt reports it was squirting blood. Now bleeding is controlled with a pressure dressing, adipose tissue exposed. tettanus not up to date. denies dizziness and weakness.

## 2013-03-24 NOTE — ED Provider Notes (Signed)
Medical screening examination/treatment/procedure(s) were performed by non-physician practitioner and as supervising physician I was immediately available for consultation/collaboration.  EKG Interpretation   None         Ziere Docken M Racquel Arkin, MD 03/24/13 0041 

## 2013-03-31 ENCOUNTER — Emergency Department (HOSPITAL_COMMUNITY)
Admission: EM | Admit: 2013-03-31 | Discharge: 2013-03-31 | Disposition: A | Discharge status: Home / Self Care | Payer: Self-pay | Attending: Emergency Medicine | Admitting: Emergency Medicine

## 2013-03-31 ENCOUNTER — Encounter (HOSPITAL_COMMUNITY): Payer: Self-pay | Admitting: Emergency Medicine

## 2013-03-31 DIAGNOSIS — Z885 Allergy status to narcotic agent status: Secondary | ICD-10-CM | POA: Insufficient documentation

## 2013-03-31 DIAGNOSIS — J4489 Other specified chronic obstructive pulmonary disease: Secondary | ICD-10-CM | POA: Insufficient documentation

## 2013-03-31 DIAGNOSIS — Z4802 Encounter for removal of sutures: Secondary | ICD-10-CM | POA: Insufficient documentation

## 2013-03-31 DIAGNOSIS — I10 Essential (primary) hypertension: Secondary | ICD-10-CM | POA: Insufficient documentation

## 2013-03-31 DIAGNOSIS — Z79899 Other long term (current) drug therapy: Secondary | ICD-10-CM | POA: Insufficient documentation

## 2013-03-31 DIAGNOSIS — Z5189 Encounter for other specified aftercare: Secondary | ICD-10-CM

## 2013-03-31 DIAGNOSIS — F17200 Nicotine dependence, unspecified, uncomplicated: Secondary | ICD-10-CM | POA: Insufficient documentation

## 2013-03-31 DIAGNOSIS — J449 Chronic obstructive pulmonary disease, unspecified: Secondary | ICD-10-CM | POA: Insufficient documentation

## 2013-03-31 MED ORDER — CEPHALEXIN 500 MG PO CAPS: 500.0000 mg | ORAL_CAPSULE | Freq: Four times a day (QID) | ORAL | Status: DC

## 2013-03-31 NOTE — ED Provider Notes (Signed)
CSN: [id number]     Arrival date & time 03/31/13  8469 History   First MD Initiated Contact with Patient 03/31/13 0720     Chief Complaint  Patient presents with  . Suture / Staple Removal   (Consider location/radiation/quality/duration/timing/severity/associated sxs/prior Treatment) The history is provided by the patient. No language interpreter was used.  John Gillespie is a 52 y/o with PMHx of COPD, essential HTN presenting to the ED for wound check and suture removal. Patient was seen in the ED on 03/23/2013 regardnig laceration secondary to falling in a hole while moving a trailer that led to a laceration that was approximately 12 cm in length to the posterior aspect of the right calf. Wound was repaired on 03/23/2013 and patient given Tetanus shot. Patient reported that the wound is doing well, reported that he had a mild reaction to the adhesive bandage that he placed on his leg. Patient reported that he is feeling good and denied any discomfort from the site. Denied fever, chills, drainage, opening of the wound, discharge, numbness, tingling, loss of sensation.  PCP none    Past Medical History  Diagnosis Date  . Smoker   . COPD (chronic obstructive pulmonary disease)   . Unspecified essential hypertension 11/30/2012   History reviewed. No pertinent past surgical history. Family History  Problem Relation Age of Onset  . COPD    . Cancer    . Cancer Father    History  Substance Use Topics  . Smoking status: Current Every Day Smoker -- 1.00 packs/day  . Smokeless tobacco: Not on file  . Alcohol Use: Yes    Review of Systems  Constitutional: Negative for fever and chills.  Musculoskeletal: Negative for myalgias.  Skin: Positive for wound (wound check and sutural removal). Negative for color change, pallor and rash.  Neurological: Negative for weakness and numbness.  All other systems reviewed and are negative.    Allergies  Codeine  Home Medications   Current  Outpatient Rx  Name  Route  Sig  Dispense  Refill  . albuterol (PROVENTIL HFA;VENTOLIN HFA) 108 (90 BASE) MCG/ACT inhaler   Inhalation   Inhale 2 puffs into the lungs every 4 (four) hours as needed for wheezing or shortness of breath (cough, shortness of breath or wheezing.).   1 Inhaler   1   . cephALEXin (KEFLEX) 500 MG capsule   Oral   Take 1 capsule (500 mg total) by mouth 4 (four) times daily.   40 capsule   0    BP 143/87  Pulse 98  Temp(Src) 98.1 F (36.7 C) (Oral)  Resp 20  Ht 5\' 10"  (1.778 m)  Wt 270 lb (122.471 kg)  BMI 38.74 kg/m2  SpO2 95% Physical Exam  Nursing note and vitals reviewed. Constitutional: He is oriented to person, place, and time. He appears well-developed and well-nourished. No distress.  HENT:  Head: Normocephalic and atraumatic.  Neck: Normal range of motion. Neck supple.  Cardiovascular:  Cap refill < 3 seconds to the toes bilaterally  Musculoskeletal: Normal range of motion. He exhibits no tenderness.  Full ROM to BLE without difficulty noted  Neurological: He is alert and oriented to person, place, and time. He exhibits normal muscle tone. Coordination normal.  Strength 5+/5+ to lower extremities bilaterally Sensation intact with differentiation to sharp and dull touch bilaterally  Skin: Skin is warm and dry. He is not diaphoretic.  Approximately 12 cm sutured laceration to the posterior aspect of the right calf. Mild erythema  surrounding the wound - negative signs of cellulitis. Negative streaking noted. Negative active drainage or bleeding noted. Negative pain upon palpation. Scab noted, wound healing well. Negative warmth upon palpation. 7 sutures placed in horizontal mattress fashion.   Psychiatric: He has a normal mood and affect. His behavior is normal. Thought content normal.    ED Course  Procedures (including critical care time) Labs Review Labs Reviewed - No data to display Imaging Review No results found.  EKG Interpretation    None       MDM   1. Visit for wound check   2. Visit for suture removal    Filed Vitals:   03/31/13 0717 03/31/13 0809  BP: 143/87 137/92  Pulse: 98 91  Temp: 98.1 F (36.7 C) 98 F (36.7 C)  TempSrc: Oral Oral  Resp: 20 18  Height: 5\' 10"  (1.778 m)   Weight: 270 lb (122.471 kg)   SpO2: 95% 96%    Patient presenting to the ED with wound check and suture removal. Patient was seen in the ED on 03/23/2013 regarding laceration repair to a 12 cm laceration localized to the posterior aspect of the right calf. Patient instructed to report back to the ED for wound check and suture removal within 8 days.  Alert and oriented. Approximately 12 cm laceration noted to the posterior aspect of the right calf with 7 sutures placed. Mild surrounding erythema noted - negative active drainage, negative swelling, negative warmth upon palpation, negative tenderness upon palpation. Negative streaking. Negative cellulitis noted. Wound healing well.  7 sutures removed. Wound cleaned and bacitracin placed and wound covered with gauze.  Patient stable, afebrile. Wound healing well. Discharged patient with Keflex for any infection that may begin. Discussed with patient to continue to keep site clean. Discussed with patient proper wound care. Discussed with patient to continue to monitor symptoms and if symptoms are to worsen or change to report back to the ED - strict return instructions given. Patient agreed to plan of care, understood, all questions answered.      Raymon Mutton, PA-C 03/31/13 2029

## 2013-03-31 NOTE — ED Notes (Signed)
Pt states just needed his sutures out. Pt denies fever and n/v/d. Marissa, PA removed stitches.

## 2013-03-31 NOTE — Progress Notes (Signed)
ED CM received call from patient stating that  He was seen at [facility] ED today and his prescription was not called in to [facility] pharmacy. While on the phone, and reviewing record noted patient not to have insurance. He reports that he is not working, and cannot afford to go to a PCP. Discussed the importance and benefits of Medical Home. Pt verbalizes understanding and is agreement. Provided verbal and written information about the [facility]. Patient provided permission to forward information to [employer] appt. Scheduler at the [facility]. Pt instructed to call [phone] if he is not contacted within 3-5 days. He verbalizes understanding. [facility] pharmacy was contacted about prescription, patient was instructed to contact pharmacy about prescription being ready for pick up. No further CM needs identified.

## 2013-03-31 NOTE — ED Notes (Signed)
Pt alert and mentating appropriately. NAD noted upon d/c. Pt ambulatory with steady gait. Pt given d/c teaching, prescription and follow up care. Pt has no further questions and verbalizes understanding of d/c teaching.

## 2013-04-01 NOTE — ED Provider Notes (Signed)
Medical screening examination/treatment/procedure(s) were conducted as a shared visit with non-physician practitioner(s) or resident and myself. I personally evaluated the patient during the encounter and agree with the findings and plan unless otherwise indicated.  I have personally reviewed any xrays and/ or EKG's with the provider and I agree with interpretation.  Right LE stitches removal. No warmth or spreading erythema around wound, non tender, stitches removed by PA. No systemic signs. Fup discussed.  Stitches removal    Enid Skeens, MD 04/01/13 [phone]

## 2013-04-02 ENCOUNTER — Telehealth: Payer: Self-pay | Admitting: General Practice

## 2013-04-02 NOTE — Telephone Encounter (Signed)
Referred by ED to establish care.  Pt established care on 11/30/12 and missed appt to apply for orange card on 12/08/12.  Called pt to reschedule appt and midway through explaining orange application pt stated that it was too much paperwork and that it was not for him and hung up.  Pt is not interested in establishing care through orange card program.

## 2013-04-14 ENCOUNTER — Ambulatory Visit: Payer: Self-pay

## 2014-06-13 ENCOUNTER — Emergency Department (HOSPITAL_COMMUNITY)
Admission: EM | Admit: 2014-06-13 | Discharge: 2014-06-13 | Disposition: A | Discharge status: Home / Self Care | Payer: Self-pay | Attending: Emergency Medicine | Admitting: Emergency Medicine

## 2014-06-13 ENCOUNTER — Encounter (HOSPITAL_COMMUNITY): Payer: Self-pay | Admitting: *Deleted

## 2014-06-13 DIAGNOSIS — J449 Chronic obstructive pulmonary disease, unspecified: Secondary | ICD-10-CM | POA: Insufficient documentation

## 2014-06-13 DIAGNOSIS — Z720 Tobacco use: Secondary | ICD-10-CM | POA: Insufficient documentation

## 2014-06-13 DIAGNOSIS — I10 Essential (primary) hypertension: Secondary | ICD-10-CM | POA: Insufficient documentation

## 2014-06-13 DIAGNOSIS — R350 Frequency of micturition: Secondary | ICD-10-CM | POA: Insufficient documentation

## 2014-06-13 DIAGNOSIS — Z79899 Other long term (current) drug therapy: Secondary | ICD-10-CM | POA: Insufficient documentation

## 2014-06-13 LAB — URINALYSIS, ROUTINE W REFLEX MICROSCOPIC
Bilirubin Urine: NEGATIVE
Glucose, UA: NEGATIVE mg/dL
Ketones, ur: NEGATIVE mg/dL
LEUKOCYTES UA: NEGATIVE
Nitrite: NEGATIVE
Protein, ur: NEGATIVE mg/dL
SPECIFIC GRAVITY, URINE: 1.027 (ref 1.005–1.030)
Urobilinogen, UA: 0.2 mg/dL (ref 0.0–1.0)
pH: 5.5 (ref 5.0–8.0)

## 2014-06-13 LAB — URINE MICROSCOPIC-ADD ON

## 2014-06-13 MED ORDER — TAMSULOSIN HCL 0.4 MG PO CAPS: 0.4000 mg | ORAL_CAPSULE | Freq: Every day | ORAL | Status: DC

## 2014-06-13 NOTE — ED Notes (Signed)
Pt states having a prostate problem and frequency x5 years.  Pt reports no burning on urination or blood present.  Pt reports he waited too long to get the problem checked out. Pt reports having to urinate q2 hours.

## 2014-06-13 NOTE — ED Notes (Signed)
Pt states that he has had prostate problems for about a "year or two" pt reports frequent urination. Pt denies any other symptoms.

## 2014-06-13 NOTE — ED Notes (Signed)
Bladder scan showed 45 ml post void.  PA notified.

## 2014-06-13 NOTE — ED Provider Notes (Signed)
CSN: [id number]     Arrival date & time 06/13/14  1352 History   First MD Initiated Contact with Patient 06/13/14 1536     Chief Complaint  Patient presents with  . Urinary Frequency     (Consider location/radiation/quality/duration/timing/severity/associated sxs/prior Treatment) HPI Comments: Patient presents today with urinary frequency for the past 4-5 years.  He reports that nothing has changed at this time, but he felt that he should finally get it checked out.  He denies known history of enlarged Prostate.  He is currently not on any medications for BPH.  He denies dysuria, fever, chills, abdominal pain, nausea, or vomiting.  Denies scrotal pain or swelling.    The history is provided by the patient.    Past Medical History  Diagnosis Date  . Smoker   . COPD (chronic obstructive pulmonary disease)   . Unspecified essential hypertension 11/30/2012   History reviewed. No pertinent past surgical history. Family History  Problem Relation Age of Onset  . COPD    . Cancer    . Cancer Father    History  Substance Use Topics  . Smoking status: Current Every Day Smoker -- 1.00 packs/day  . Smokeless tobacco: Not on file  . Alcohol Use: Yes    Review of Systems  All other systems reviewed and are negative.     Allergies  Codeine  Home Medications   Prior to Admission medications   Medication Sig Start Date End Date Taking? Authorizing Provider  albuterol (PROVENTIL HFA;VENTOLIN HFA) 108 (90 BASE) MCG/ACT inhaler Inhale 2 puffs into the lungs every 4 (four) hours as needed for wheezing or shortness of breath (cough, shortness of breath or wheezing.). 11/30/12   Clanford Cyndie MullL Johnson, MD  cephALEXin (KEFLEX) 500 MG capsule Take 1 capsule (500 mg total) by mouth 4 (four) times daily. 03/31/13   Marissa Sciacca, PA-C   BP 115/73 mmHg  Pulse 85  Temp(Src) 98.2 F (36.8 C) (Oral)  Resp 18  SpO2 98% Physical Exam  Constitutional: He appears well-developed and well-nourished.   HENT:  Head: Normocephalic and atraumatic.  Mouth/Throat: Oropharynx is clear and moist.  Neck: Normal range of motion. Neck supple.  Cardiovascular: Normal rate, regular rhythm and normal heart sounds.   Pulmonary/Chest: Effort normal and breath sounds normal.  Abdominal: Soft. Bowel sounds are normal. He exhibits no distension and no mass. There is no tenderness. There is no rebound and no guarding.  Genitourinary:  Prostate feels enlarged, but is not tender  Neurological: He is alert.  Skin: Skin is warm and dry.  Psychiatric: He has a normal mood and affect.  Nursing note and vitals reviewed.   ED Course  Procedures (including critical care time) Labs Review Labs Reviewed  URINALYSIS, ROUTINE W REFLEX MICROSCOPIC - Abnormal; Notable for the following:    Hgb urine dipstick TRACE (*)    All other components within normal limits  URINE MICROSCOPIC-ADD ON - Abnormal; Notable for the following:    Bacteria, UA FEW (*)    All other components within normal limits    Imaging Review No results found.   EKG Interpretation None      MDM   Final diagnoses:  None   Patient presents today with urinary frequency for 4-5 years.  No evidence of infection on UA.  Prostate feels enlarged, but was not tender with exam.  No signs of urinary retention.  Post void residual was 45 cc.  Suspect BPH.  Patient started on Flomax and given follow  up with Urology.  Patient discussed with Dr. Judd Lien who is in agreement with the plan.  Return precautions given to the patient.      Santiago Glad, PA-C 06/14/14 1115  Geoffery Lyons, MD 06/14/14 [phone]

## 2014-06-13 NOTE — ED Notes (Signed)
Pt made aware to return if symptoms worsen or if any life threatening symptoms occur.   

## 2014-10-27 ENCOUNTER — Encounter (HOSPITAL_COMMUNITY): Payer: Self-pay

## 2014-10-27 ENCOUNTER — Emergency Department (HOSPITAL_COMMUNITY)
Admission: EM | Admit: 2014-10-27 | Discharge: 2014-10-27 | Disposition: A | Discharge status: Home / Self Care | Payer: Self-pay | Attending: Emergency Medicine | Admitting: Emergency Medicine

## 2014-10-27 DIAGNOSIS — I10 Essential (primary) hypertension: Secondary | ICD-10-CM | POA: Insufficient documentation

## 2014-10-27 DIAGNOSIS — Z720 Tobacco use: Secondary | ICD-10-CM | POA: Insufficient documentation

## 2014-10-27 DIAGNOSIS — Z792 Long term (current) use of antibiotics: Secondary | ICD-10-CM | POA: Insufficient documentation

## 2014-10-27 DIAGNOSIS — M6500 Abscess of tendon sheath, unspecified site: Secondary | ICD-10-CM | POA: Insufficient documentation

## 2014-10-27 DIAGNOSIS — Z79899 Other long term (current) drug therapy: Secondary | ICD-10-CM | POA: Insufficient documentation

## 2014-10-27 DIAGNOSIS — J449 Chronic obstructive pulmonary disease, unspecified: Secondary | ICD-10-CM | POA: Insufficient documentation

## 2014-10-27 DIAGNOSIS — M6790 Unspecified disorder of synovium and tendon, unspecified site: Secondary | ICD-10-CM

## 2014-10-27 MED ORDER — HYDROCODONE-ACETAMINOPHEN 5-325 MG PO TABS: 1.0000 | ORAL_TABLET | Freq: Four times a day (QID) | ORAL | Status: DC | PRN

## 2014-10-27 MED ORDER — CEPHALEXIN 500 MG PO CAPS: 1000.0000 mg | ORAL_CAPSULE | Freq: Two times a day (BID) | ORAL | Status: DC

## 2014-10-27 NOTE — ED Provider Notes (Signed)
CSN: [id number]     Arrival date & time 10/27/14  [id number] History   First MD Initiated Contact with Patient 10/27/14 [phone]     Chief Complaint  Patient presents with  . Foot Pain     (Consider location/radiation/quality/duration/timing/severity/associated sxs/prior Treatment) HPI Patient presents to the emergency department with pain and a red area, between his first and second toes on the right foot .  Patient states that he has the cyst throughout his whole body.  The patient states that he has had this area, therefore, several weeks.  This got more painful and red over the last couple days.  Patient denies fever, nausea, vomiting, weakness, dizziness, headache, blurred vision, back pain, or numbness.  Patient states that his wife tried to press on the area to express any material from the area Past Medical History  Diagnosis Date  . Smoker   . COPD (chronic obstructive pulmonary disease)   . Unspecified essential hypertension 11/30/2012   History reviewed. No pertinent past surgical history. Family History  Problem Relation Age of Onset  . COPD    . Cancer    . Cancer Father    History  Substance Use Topics  . Smoking status: Current Every Day Smoker -- 1.00 packs/day  . Smokeless tobacco: Not on file  . Alcohol Use: Yes    Review of Systems All other systems negative except as documented in the HPI. All pertinent positives and negatives as reviewed in the HPI.   Allergies  Codeine  Home Medications   Prior to Admission medications   Medication Sig Start Date End Date Taking? Authorizing Provider  albuterol (PROVENTIL HFA;VENTOLIN HFA) 108 (90 BASE) MCG/ACT inhaler Inhale 2 puffs into the lungs every 4 (four) hours as needed for wheezing or shortness of breath (cough, shortness of breath or wheezing.). 11/30/12   Clanford Cyndie MullL Johnson, MD  cephALEXin (KEFLEX) 500 MG capsule Take 1 capsule (500 mg total) by mouth 4 (four) times daily. 03/31/13   Marissa Sciacca, PA-C  tamsulosin  (FLOMAX) 0.4 MG CAPS capsule Take 1 capsule (0.4 mg total) by mouth daily. 06/13/14   Heather Laisure, PA-C   BP 136/89 mmHg  Pulse 79  Temp(Src) 97.7 F (36.5 C)  Resp 16  Ht 5\' 10"  (1.778 m)  Wt 257 lb (116.574 kg)  BMI 36.88 kg/m2  SpO2 100% Physical Exam  Constitutional: He is oriented to person, place, and time. He appears well-developed and well-nourished.  HENT:  Head: Normocephalic and atraumatic.  Eyes: Pupils are equal, round, and reactive to light.  Neck: Normal range of motion. Neck supple.  Cardiovascular: Normal rate, regular rhythm and normal heart sounds.   Musculoskeletal:       Feet:  Neurological: He is alert and oriented to person, place, and time.  Skin: Skin is warm and dry.  Nursing note and vitals reviewed.   ED Course  Procedures (including critical care time)    The patient is referred to orthopedics due to the fact this could involve tendons.  I advised him to use heat on the area and elevation to return here as needed  Charlestine NightChristopher Marceline Napierala, PA-C 10/28/14 2122  Gerhard Munchobert Lockwood, MD 10/29/14 [phone]

## 2014-10-27 NOTE — ED Notes (Signed)
Pt noticed a red spot just below his toes on the right foot a couple days ago and not sure what it is but it hurts.

## 2014-10-27 NOTE — Progress Notes (Signed)
John DutyFelicia Gillespie [facility] [phone]  Spoke with patient regarding primary care resources and [facility]. Patient states he has no insurance or pcp at this time. Patient is familiar with the orange card. Patient will meet with me to [facility] once information is obtained. [facility] for [facility] which was schedule for June 27 @ 3:30pm. Orange card application provided and explained. Consulted [facility] case management in regards to a follow up appointment with the clinic as well. Patient verbalized understanding of this plan of care. My contact information also given for any future questions or concerns. No other [facility] Eligibility Specialist needs identified at this time.

## 2014-10-27 NOTE — Discharge Planning (Signed)
John Gillespie J. Clydene Laming, RN, [place], [place] [phone] ED CM consulted to meet with patient concerning f/u care with PCP and patient does not have insurance. Pt presented to [facility] ED today with foot pain.  Met with patient at bedside, confirmed informaton. Pt  reports not having access to f/u care with PCP, or insurance coverage. Discussed with patient importance and benefits of  establishing PCP, and not utilizing the ED for primary care needs. Pt verbalized understanding and is in agreement. Discussed other options, provided list of local  affordable PCPs.  Pt voiced interest in the [facility] and [place].  [facility] Brochure given with address, phone number, and the services highlighted. Explained that there is a Customer service manager on site who will assist with [employer] and process. Instructed that appt is June 27 @ 2:00. Pt verbalized understanding.

## 2014-10-27 NOTE — Discharge Instructions (Signed)
Return here as needed.  Follow-up with the orthopedist provided.  Use ice and elevation on the area

## 2014-11-21 ENCOUNTER — Ambulatory Visit: Payer: Self-pay

## 2014-11-21 ENCOUNTER — Ambulatory Visit: Payer: Self-pay | Admitting: Family Medicine

## 2016-07-02 ENCOUNTER — Emergency Department (HOSPITAL_COMMUNITY): Payer: Self-pay

## 2016-07-02 ENCOUNTER — Encounter (HOSPITAL_COMMUNITY): Payer: Self-pay | Admitting: Emergency Medicine

## 2016-07-02 ENCOUNTER — Emergency Department (HOSPITAL_COMMUNITY)
Admission: EM | Admit: 2016-07-02 | Discharge: 2016-07-02 | Disposition: A | Discharge status: Home / Self Care | Payer: Self-pay | Attending: Emergency Medicine | Admitting: Emergency Medicine

## 2016-07-02 DIAGNOSIS — J449 Chronic obstructive pulmonary disease, unspecified: Secondary | ICD-10-CM | POA: Insufficient documentation

## 2016-07-02 DIAGNOSIS — F17200 Nicotine dependence, unspecified, uncomplicated: Secondary | ICD-10-CM | POA: Insufficient documentation

## 2016-07-02 DIAGNOSIS — J209 Acute bronchitis, unspecified: Secondary | ICD-10-CM | POA: Insufficient documentation

## 2016-07-02 MED ORDER — PREDNISONE 20 MG PO TABS: 40.0000 mg | ORAL_TABLET | Freq: Every day | ORAL | 0 refills | Status: DC

## 2016-07-02 MED ORDER — AZITHROMYCIN 250 MG PO TABS: ORAL_TABLET | ORAL | 0 refills | Status: DC

## 2016-07-02 NOTE — ED Notes (Signed)
Pt states he understands instructions. Home stable with steady gait. 

## 2016-07-02 NOTE — Discharge Instructions (Signed)
Get help right away if: °You cough up blood. °You have chest pain. °You have severe shortness of breath. °You become dehydrated. °You faint or keep feeling like you are going to faint. °You keep vomiting. °You have a severe headache. °Your fever or chills gets worse. °

## 2016-07-02 NOTE — ED Triage Notes (Signed)
Pt sts painful cough with fever 2 days ago

## 2016-07-02 NOTE — ED Provider Notes (Signed)
[facility]-EMERGENCY DEPT Provider Note   CSN: [id number] Arrival date & time: 07/02/16  4098     History   Chief Complaint Chief Complaint  Patient presents with  . Cough    HPI John Gillespie is a 56 y.o. male who  has a past medical history of COPD (chronic obstructive pulmonary disease) (HCC); Smoker; and Unspecified essential hypertension (11/30/2012). He presents with cc of cough.He is a chronic daily smoker with a history of COPD. 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 Sunday he states he felt like he began running a fever but did not tested. Sunday night he began coughing and has had a painful, productive cough since that time. He has not taken any medications to treat his symptoms. He denies wheezing or shortness of breath.    HPI  Past Medical History:  Diagnosis Date  . COPD (chronic obstructive pulmonary disease) (HCC)   . Smoker   . Unspecified essential hypertension 11/30/2012    Patient Active Problem List   Diagnosis Date Noted  . COPD (chronic obstructive pulmonary disease) (HCC) 11/30/2012  . Unspecified essential hypertension 11/30/2012  . Smoker 11/30/2012    History reviewed. No pertinent surgical history.     Home Medications    Prior to Admission medications   Medication Sig Start Date End Date Taking? Authorizing Provider  albuterol (PROVENTIL HFA;VENTOLIN HFA) 108 (90 BASE) MCG/ACT inhaler Inhale 2 puffs into the lungs every 4 (four) hours as needed for wheezing or shortness of breath (cough, shortness of breath or wheezing.). 11/30/12   [place] Cyndie Mull, MD  azithromycin (ZITHROMAX Z-PAK) 250 MG tablet 2 po day one, then 1 daily x 4 days 07/02/16   Arthor Captain, PA-C  cephALEXin (KEFLEX) 500 MG capsule Take 2 capsules (1,000 mg total) by mouth 2 (two) times daily. 10/27/14   Charlestine Night, PA-C  HYDROcodone-acetaminophen (NORCO/VICODIN)  5-325 MG per tablet Take 1 tablet by mouth every 6 (six) hours as needed for moderate pain. 10/27/14   Charlestine Night, PA-C  predniSONE (DELTASONE) 20 MG tablet Take 2 tablets (40 mg total) by mouth daily. 07/02/16   Arthor Captain, PA-C  tamsulosin (FLOMAX) 0.4 MG CAPS capsule Take 1 capsule (0.4 mg total) by mouth daily. 06/13/14   Santiago Glad, PA-C    Family History Family History  Problem Relation Age of Onset  . COPD    . Cancer    . Cancer Father     Social History Social History  Substance Use Topics  . Smoking status: Current Every Day Smoker    Packs/day: 1.00  . Smokeless tobacco: Not on file  . Alcohol use Yes     Allergies   Codeine   Review of Systems Review of Systems  Constitutional: Positive for chills and fever.  Respiratory: Positive for cough.   Musculoskeletal: Positive for myalgias.     Physical Exam Updated Vital Signs BP 108/78   Pulse 81   Temp 99.6 F (37.6 C) (Oral)   Resp 18   SpO2 96%   Physical Exam  Constitutional: He appears well-developed and well-nourished. No distress.  HENT:  Head: Normocephalic and atraumatic.  Eyes: Conjunctivae and EOM are normal. Pupils are equal, round, and reactive to light. No scleral icterus.  Neck: Normal range of motion. Neck supple.  Cardiovascular: Normal rate, regular rhythm and normal heart sounds.   Pulmonary/Chest: Effort  normal and breath sounds normal. No respiratory distress. He has no wheezes.  Rhonchi that clear with cough  Abdominal: Soft. There is no tenderness.  Central obesity  Musculoskeletal: He exhibits no edema.  Neurological: He is alert.  Skin: Skin is warm and dry. He is not diaphoretic.  Psychiatric: His behavior is normal.  Nursing note and vitals reviewed.    ED Treatments / Results  Labs (all labs ordered are listed, but only abnormal results are displayed) Labs Reviewed - No data to display  EKG  EKG Interpretation None       Radiology Dg Chest 2  View  Result Date: 07/02/2016 CLINICAL DATA:  Nonproductive cough and fever for the past 3 days with recent onset of pleuritic chest pain. History of COPD, current smoker. EXAM: CHEST  2 VIEW COMPARISON:  PA and lateral chest x-ray of May 06, 2011 FINDINGS: The lungs are mildly hyperinflated. The interstitial markings are coarse though stable. There is no alveolar infiltrate. There is no pleural effusion. The heart and mediastinal structures are normal. The trachea is midline. The bony thorax exhibits no acute abnormality. IMPRESSION: Mild hyperinflation consistent with COPD. Mild interstitial prominence, chronic, consistent with the patient's smoking history. No acute pneumonia. Electronically Signed   By: John  Gillespie M.D.   On: 07/02/2016 08:27    Procedures Procedures (including critical care time)  Medications Ordered in ED Medications - No data to display   Initial Impression / Assessment and Plan / ED Course  I have reviewed the triage vital signs and the nursing notes.  Pertinent labs & imaging results that were available during my care of the patient were reviewed by me and considered in my medical decision making (see chart for details).     This is a 56 year old male with COPD and acute bronchitis. We'll treat as COPD exacerbation with azithromycin and prednisone. He is afebrile. Patient has soft blood pressures, but states that his blood pressures normally run between 100-115. Discussed return precautions with the patient. Pierce safe for discharge at this time Final Clinical Impressions(s) / ED Diagnoses   Final diagnoses:  Acute bronchitis, unspecified organism    New Prescriptions New Prescriptions   AZITHROMYCIN (ZITHROMAX Z-PAK) 250 MG TABLET    2 po day one, then 1 daily x 4 days   PREDNISONE (DELTASONE) 20 MG TABLET    Take 2 tablets (40 mg total) by mouth daily.     Arthor Captainbigail Sloane Palmer, PA-C 07/02/16 [id number]    Lyndal Pulleyaniel Knott, MD 07/02/16 [phone]

## 2016-07-02 NOTE — ED Notes (Signed)
Patient transported to X-ray 

## 2017-05-31 ENCOUNTER — Encounter: Payer: Self-pay | Admitting: Family Medicine

## 2017-05-31 ENCOUNTER — Ambulatory Visit: Payer: Self-pay | Admitting: Family Medicine

## 2017-05-31 ENCOUNTER — Other Ambulatory Visit: Payer: Self-pay

## 2017-05-31 VITALS — BP 128/78 | HR 86 | Temp 99.0°F | Resp 16 | Ht 70.47 in | Wt 256.0 lb

## 2017-05-31 DIAGNOSIS — Q559 Congenital malformation of male genital organ, unspecified: Secondary | ICD-10-CM

## 2017-05-31 NOTE — Patient Instructions (Addendum)
The area on your left scrotum appears to be possible hernia. See information below on that condition. I will schedule an ultrasound based on the location of your symptoms then can determine if general surgery evaluation would be recommended based on those results.  Let me know if there are questions in the meantime. Would recommend avoiding heavy lifting and straining for now.   Return to the clinic or go to the nearest emergency room if any of your symptoms worsen or new symptoms occur.  Hernia, Adult A hernia is the bulging of an organ or tissue through a weak spot in the muscles of the abdomen (abdominal wall). Hernias develop most often near the navel or groin. There are many kinds of hernias. Common kinds include:  Femoral hernia. This kind of hernia develops under the groin in the upper thigh area.  Inguinal hernia. This kind of hernia develops in the groin or scrotum.  Umbilical hernia. This kind of hernia develops near the navel.  Hiatal hernia. This kind of hernia causes part of the stomach to be pushed up into the chest.  Incisional hernia. This kind of hernia bulges through a scar from an abdominal surgery.  What are the causes? This condition may be caused by:  Heavy lifting.  Coughing over a long period of time.  Straining to have a bowel movement.  An incision made during an abdominal surgery.  A birth defect (congenital defect).  Excess weight or obesity.  Smoking.  Poor nutrition.  Cystic fibrosis.  Excess fluid in the abdomen.  Undescended testicles.  What are the signs or symptoms? Symptoms of a hernia include:  A lump on the abdomen. This is the first sign of a hernia. The lump may become more obvious with standing, straining, or coughing. It may get bigger over time if it is not treated or if the condition causing it is not treated.  Pain. A hernia is usually painless, but it may become painful over time if treatment is delayed. The pain is usually  dull and may get worse with standing or lifting heavy objects.  Sometimes a hernia gets tightly squeezed in the weak spot (strangulated) or stuck there (incarcerated) and causes additional symptoms. These symptoms may include:  Vomiting.  Nausea.  Constipation.  Irritability.  How is this diagnosed? A hernia may be diagnosed with:  A physical exam. During the exam your health care provider may ask you to cough or to make a specific movement, because a hernia is usually more visible when you move.  Imaging tests. These can include: ? X-rays. ? Ultrasound. ? CT scan.  How is this treated? A hernia that is small and painless may not need to be treated. A hernia that is large or painful may be treated with surgery. Inguinal hernias may be treated with surgery to prevent incarceration or strangulation. Strangulated hernias are always treated with surgery, because lack of blood to the trapped organ or tissue can cause it to die. Surgery to treat a hernia involves pushing the bulge back into place and repairing the weak part of the abdomen. Follow these instructions at home:  Avoid straining.  Do not lift anything heavier than 10 lb (4.5 kg).  Lift with your leg muscles, not your back muscles. This helps avoid strain.  When coughing, try to cough gently.  Prevent constipation. Constipation leads to straining with bowel movements, which can make a hernia worse or cause a hernia repair to break down. You can prevent constipation by: ?  Eating a high-fiber diet that includes plenty of fruits and vegetables. ? Drinking enough fluids to keep your urine clear or pale yellow. Aim to drink 6-8 glasses of water per day. ? Using a stool softener as directed by your health care provider.  Lose weight, if you are overweight.  Do not use any tobacco products, including cigarettes, chewing tobacco, or electronic cigarettes. If you need help quitting, ask your health care provider.  Keep all  follow-up visits as directed by your health care provider. This is important. Your health care provider may need to monitor your condition. Contact a health care provider if:  You have swelling, redness, and pain in the affected area.  Your bowel habits change. Get help right away if:  You have a fever.  You have abdominal pain that is getting worse.  You feel nauseous or you vomit.  You cannot push the hernia back in place by gently pressing on it while you are lying down.  The hernia: ? Changes in shape or size. ? Is stuck outside the abdomen. ? Becomes discolored. ? Feels hard or tender. This information is not intended to replace advice given to you by your health care provider. Make sure you discuss any questions you have with your health care provider. Document Released: 05/13/2005 Document Revised: 10/11/2015 Document Reviewed: 03/23/2014 Elsevier Interactive Patient Education  2017 [employer]    IF you received an x-ray today, you will receive an invoice from [facility]. Please contact [facility] at [phone] with questions or concerns regarding your invoice.   IF you received labwork today, you will receive an invoice from [facility]. Please contact [facility] at [phone] with questions or concerns regarding your invoice.   Our billing staff will not be able to assist you with questions regarding bills from these companies.  You will be contacted with the lab results as soon as they are available. The fastest way to get your results is to activate your My Chart account. Instructions are located on the last page of this paperwork. If you have not heard from us regarding the results in 2 weeks, please contact this office.

## 2017-05-31 NOTE — Progress Notes (Signed)
Subjective:  By signing my name below, I, Stann Ore, attest that this documentation has been prepared under the direction and in the presence of Meredith Staggers, MD. Electronically Signed: Stann Ore, Scribe. 05/31/2017 , 3:35 PM .  Patient was seen in Room 10 .   Patient ID: John Gillespie, male    DOB: 05/07/1961, 57 y.o.   MRN: [id number] Chief Complaint  Patient presents with  . Cyst    pt states he noticed a knot near his testicals    HPI John Gillespie is a 57 y.o. male  Patient complains noticing a knot between his penis and his testicles yesterday. He was standing up and reaching for an object on the shelf, and his girlfriend noticed the knot. Later in the day, she performed oral sex and after he ejaculated, the knot had reduced in size and became even less noticeable today. He describes the knot being bigger than a grape, and smaller than tangerine. He denies any pain in the area. He denies history of hernia, denies straining, denies penile discharge or dysuria. He mentions younger brother has a history of hernias.   He mentions nocturia x3-4 a night and taking herbal supplements.   Family history He mentions grandfather with history of lung cancer and emphysema.   Health maintenance He reports quit smoking and quit drinking over the past few years.   Patient Active Problem List   Diagnosis Date Noted  . COPD (chronic obstructive pulmonary disease) (HCC) 11/30/2012  . Unspecified essential hypertension 11/30/2012  . Smoker 11/30/2012   Past Medical History:  Diagnosis Date  . COPD (chronic obstructive pulmonary disease) (HCC)   . Smoker   . Unspecified essential hypertension 11/30/2012   History reviewed. No pertinent surgical history. Allergies  Allergen Reactions  . Codeine Other (See Comments)    Unknown per patient   Prior to Admission medications   Medication Sig Start Date End Date Taking? Authorizing Provider  albuterol (PROVENTIL HFA;VENTOLIN HFA)  108 (90 BASE) MCG/ACT inhaler Inhale 2 puffs into the lungs every 4 (four) hours as needed for wheezing or shortness of breath (cough, shortness of breath or wheezing.). 11/30/12   Laural Benes, Clanford L, MD  azithromycin (ZITHROMAX Z-PAK) 250 MG tablet 2 po day one, then 1 daily x 4 days 07/02/16   Arthor Captain, PA-C  cephALEXin (KEFLEX) 500 MG capsule Take 2 capsules (1,000 mg total) by mouth 2 (two) times daily. 10/27/14   Lawyer, Cristal Deer, PA-C  HYDROcodone-acetaminophen (NORCO/VICODIN) 5-325 MG per tablet Take 1 tablet by mouth every 6 (six) hours as needed for moderate pain. 10/27/14   Lawyer, Cristal Deer, PA-C  predniSONE (DELTASONE) 20 MG tablet Take 2 tablets (40 mg total) by mouth daily. 07/02/16   Arthor Captain, PA-C  tamsulosin (FLOMAX) 0.4 MG CAPS capsule Take 1 capsule (0.4 mg total) by mouth daily. 06/13/14   Santiago Glad, PA-C   Social History   Socioeconomic History  . Marital status: Single    Spouse name: Not on file  . Number of children: Not on file  . Years of education: Not on file  . Highest education level: Not on file  Social Needs  . Financial resource strain: Not on file  . Food insecurity - worry: Not on file  . Food insecurity - inability: Not on file  . Transportation needs - medical: Not on file  . Transportation needs - non-medical: Not on file  Occupational History  . Occupation: Fish farm manager  Employer: UNEMPLOYED  Tobacco Use  . Smoking status: Current Every Day Smoker    Packs/day: 1.00  . Smokeless tobacco: Never Used  Substance and Sexual Activity  . Alcohol use: Yes  . Drug use: No    Comment: used in past   . Sexual activity: No  Other Topics Concern  . Not on file  Social History Narrative  . Not on file   Review of Systems  Constitutional: Negative for chills, fatigue, fever and unexpected weight change.  Gastrointestinal: Negative for abdominal pain.  Genitourinary: Positive for genital sores. Negative  for discharge, dysuria, frequency, hematuria, penile pain, testicular pain and urgency.  Neurological: Negative for dizziness, light-headedness and headaches.       Objective:   Physical Exam  Constitutional: He is oriented to person, place, and time. He appears well-developed and well-nourished. No distress.  HENT:  Head: Normocephalic and atraumatic.  Eyes: EOM are normal. Pupils are equal, round, and reactive to light.  Neck: Neck supple.  Cardiovascular: Normal rate.  Pulmonary/Chest: Effort normal. No respiratory distress.  Genitourinary: Right testis shows no tenderness. Left testis shows no tenderness. No discharge found.  Genitourinary Comments: Slight bulge to the left upper scrotal with valsalva; testicles non tender, no apparent cyst or nodule on testicles, epididymis non tender, no discharge or rash on penis; no apparent inguinal hernia  Musculoskeletal: Normal range of motion.  Neurological: He is alert and oriented to person, place, and time.  Skin: Skin is warm and dry.  Psychiatric: He has a normal mood and affect. His behavior is normal.  Nursing note and vitals reviewed.   Vitals:   05/31/17 1457  BP: 128/78  Pulse: 86  Resp: 16  Temp: 99 F (37.2 C)  TempSrc: Oral  SpO2: 97%  Weight: 256 lb (116.1 kg)  Height: 5' 10.47" (1.79 m)      Assessment & Plan:   John Gillespie is a 57 y.o. male Scrotal anomaly - Plan: US Scrotum  - possible hernia by history and may have hernia sac into scrotum on exam. No other concerning findings. Will clarify with scrotal ultrasound, then possible surgical eval. rtc and hernia precautions discussed.   No orders of the defined types were placed in this encounter.  Patient Instructions   The area on your left scrotum appears to be possible hernia. See information below on that condition. I will schedule an ultrasound based on the location of your symptoms then can determine if general surgery evaluation would be recommended  based on those results.  Let me know if there are questions in the meantime. Would recommend avoiding heavy lifting and straining for now.   Return to the clinic or go to the nearest emergency room if any of your symptoms worsen or new symptoms occur.  Hernia, Adult A hernia is the bulging of an organ or tissue through a weak spot in the muscles of the abdomen (abdominal wall). Hernias develop most often near the navel or groin. There are many kinds of hernias. Common kinds include:  Femoral hernia. This kind of hernia develops under the groin in the upper thigh area.  Inguinal hernia. This kind of hernia develops in the groin or scrotum.  Umbilical hernia. This kind of hernia develops near the navel.  Hiatal hernia. This kind of hernia causes part of the stomach to be pushed up into the chest.  Incisional hernia. This kind of hernia bulges through a scar from an abdominal surgery.  What are the causes?  This condition may be caused by:  Heavy lifting.  Coughing over a long period of time.  Straining to have a bowel movement.  An incision made during an abdominal surgery.  A birth defect (congenital defect).  Excess weight or obesity.  Smoking.  Poor nutrition.  Cystic fibrosis.  Excess fluid in the abdomen.  Undescended testicles.  What are the signs or symptoms? Symptoms of a hernia include:  A lump on the abdomen. This is the first sign of a hernia. The lump may become more obvious with standing, straining, or coughing. It may get bigger over time if it is not treated or if the condition causing it is not treated.  Pain. A hernia is usually painless, but it may become painful over time if treatment is delayed. The pain is usually dull and may get worse with standing or lifting heavy objects.  Sometimes a hernia gets tightly squeezed in the weak spot (strangulated) or stuck there (incarcerated) and causes additional symptoms. These symptoms may  include:  Vomiting.  Nausea.  Constipation.  Irritability.  How is this diagnosed? A hernia may be diagnosed with:  A physical exam. During the exam your health care provider may ask you to cough or to make a specific movement, because a hernia is usually more visible when you move.  Imaging tests. These can include: ? X-rays. ? Ultrasound. ? CT scan.  How is this treated? A hernia that is small and painless may not need to be treated. A hernia that is large or painful may be treated with surgery. Inguinal hernias may be treated with surgery to prevent incarceration or strangulation. Strangulated hernias are always treated with surgery, because lack of blood to the trapped organ or tissue can cause it to die. Surgery to treat a hernia involves pushing the bulge back into place and repairing the weak part of the abdomen. Follow these instructions at home:  Avoid straining.  Do not lift anything heavier than 10 lb (4.5 kg).  Lift with your leg muscles, not your back muscles. This helps avoid strain.  When coughing, try to cough gently.  Prevent constipation. Constipation leads to straining with bowel movements, which can make a hernia worse or cause a hernia repair to break down. You can prevent constipation by: ? Eating a high-fiber diet that includes plenty of fruits and vegetables. ? Drinking enough fluids to keep your urine clear or pale yellow. Aim to drink 6-8 glasses of water per day. ? Using a stool softener as directed by your health care provider.  Lose weight, if you are overweight.  Do not use any tobacco products, including cigarettes, chewing tobacco, or electronic cigarettes. If you need help quitting, ask your health care provider.  Keep all follow-up visits as directed by your health care provider. This is important. Your health care provider may need to monitor your condition. Contact a health care provider if:  You have swelling, redness, and pain in the  affected area.  Your bowel habits change. Get help right away if:  You have a fever.  You have abdominal pain that is getting worse.  You feel nauseous or you vomit.  You cannot push the hernia back in place by gently pressing on it while you are lying down.  The hernia: ? Changes in shape or size. ? Is stuck outside the abdomen. ? Becomes discolored. ? Feels hard or tender. This information is not intended to replace advice given to you by your health care provider.  Make sure you discuss any questions you have with your health care provider. Document Released: 05/13/2005 Document Revised: 10/11/2015 Document Reviewed: 03/23/2014 Elsevier Interactive Patient Education  2017 [employer].    IF you received an x-ray today, you will receive an invoice from [facility]. Please contact [facility] at [phone] with questions or concerns regarding your invoice.   IF you received labwork today, you will receive an invoice from [facility]. Please contact [facility] at [phone] with questions or concerns regarding your invoice.   Our billing staff will not be able to assist you with questions regarding bills from these companies.  You will be contacted with the lab results as soon as they are available. The fastest way to get your results is to activate your My Chart account. Instructions are located on the last page of this paperwork. If you have not heard from [place] regarding the results in 2 weeks, please contact this office.       I personally performed the services described in this documentation, which was scribed in my presence. The recorded information has been reviewed and considered for accuracy and completeness, addended by me as needed, and agree with information above.  Signed,   Meredith Staggers, MD Primary Care at [facility].  06/01/17 9:36 PM

## 2017-06-16 IMAGING — DX DG CHEST 2V
2 series · 2 of 2 positions shown · non-contrast
Comparison: PA and lateral chest x-ray May 06, 2011

CLINICAL DATA: Nonproductive cough and fever for the past 3 days
with recent onset of pleuritic chest pain. History of COPD, current
smoker.

EXAM:
CHEST  2 VIEW

[chest pa]
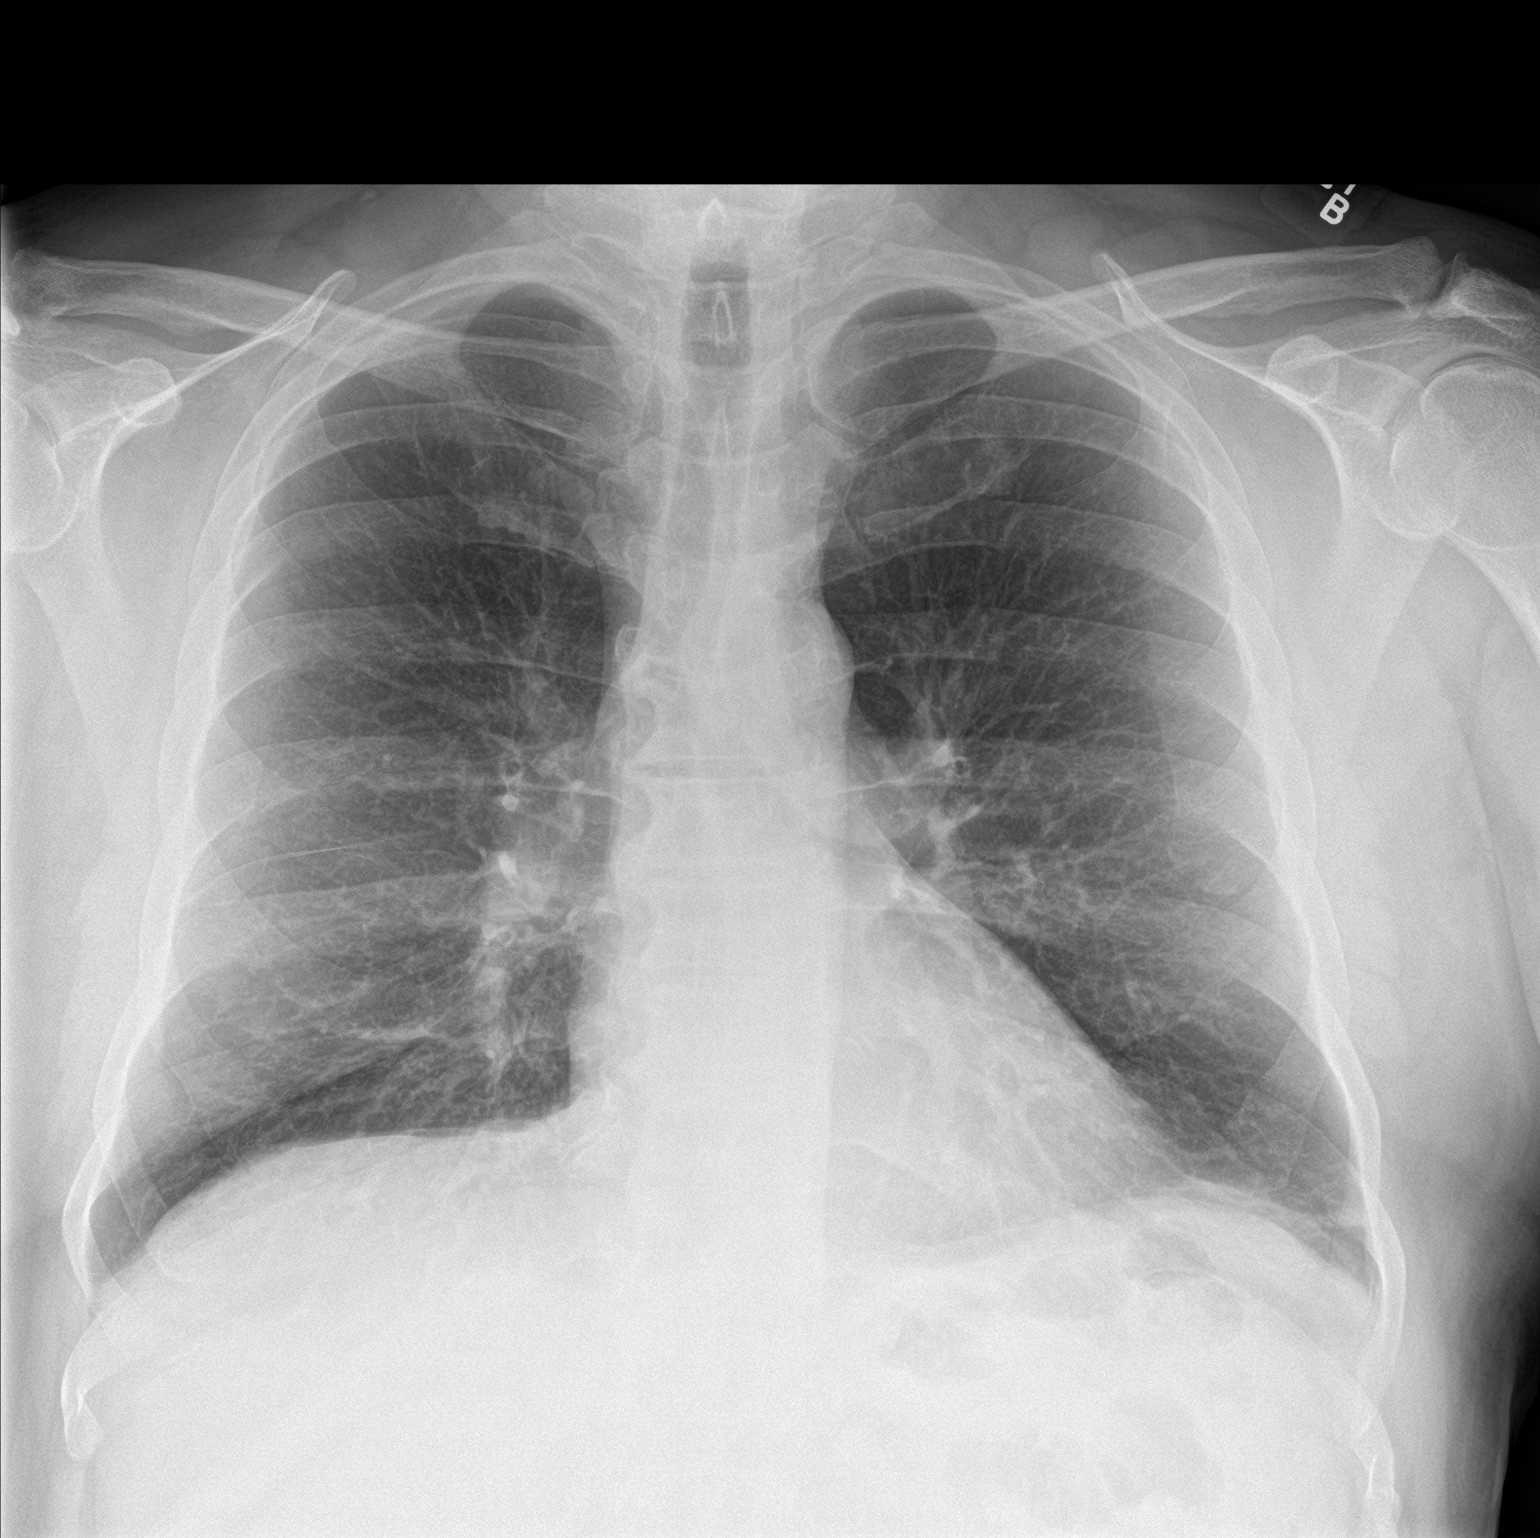

[chest lat]
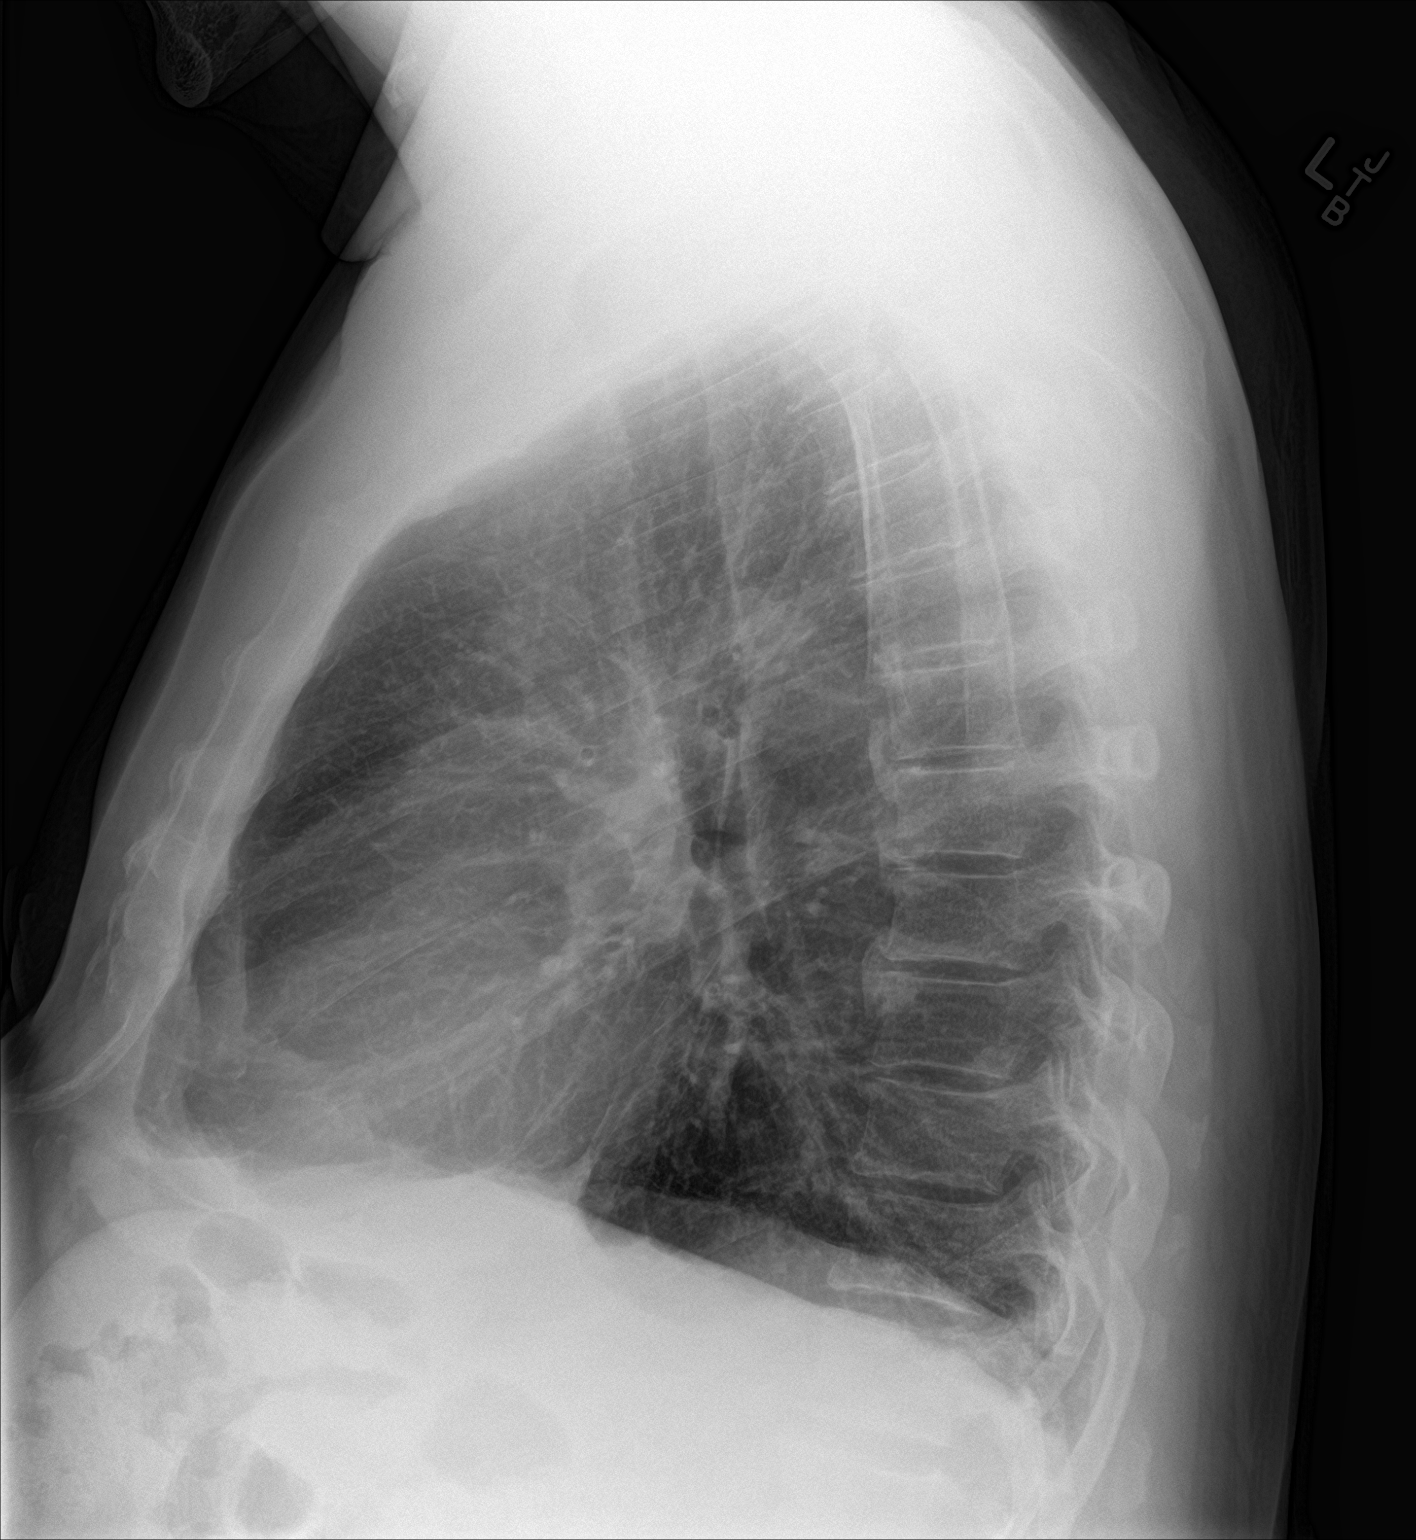

[2 of 2 positions shown; findings below may reference images not displayed]

FINDINGS: The lungs are mildly hyperinflated. The interstitial markings are
coarse though stable. There is no alveolar infiltrate. There is no
pleural effusion. The heart and mediastinal structures are normal.
The trachea is midline. The bony thorax exhibits no acute
abnormality.
IMPRESSION: Mild hyperinflation consistent with COPD. Mild interstitial
prominence, chronic, consistent with the patient's smoking history.
No acute pneumonia.

## 2017-11-07 ENCOUNTER — Telehealth: Payer: Self-pay | Admitting: Family Medicine

## 2017-11-07 NOTE — Telephone Encounter (Signed)
Copied from CRM [phone]#[id number]. Topic: General - Other >> Nov 07, 2017 10:03 AM Marylen PontoMcneil, Ja-Kwan wrote: Reason for CRM: Mitz[facility]helby [facility]. Shelby asked if the Ultrasound is still needed and if so the request should be for an ultrasound scrotum with doppler. Cb#[phone].Marland Kitchen.  Please advise

## 2017-11-09 NOTE — Telephone Encounter (Signed)
Yes, would still recommend ultrasound if he has not had abnormality evaluated since we discussed back in January.  Please clarify with patient whether or not he has had that imaging performed, and if not I can reorder ultrasound with Doppler.  Thanks.

## 2017-11-10 NOTE — Telephone Encounter (Signed)
Patient would like to have ultrasound ordered.  He states he will follow up after this is done

## 2017-11-14 ENCOUNTER — Other Ambulatory Visit: Payer: Self-pay

## 2017-11-14 DIAGNOSIS — Q559 Congenital malformation of male genital organ, unspecified: Secondary | ICD-10-CM

## 2017-11-14 NOTE — Telephone Encounter (Signed)
[facility] is calling checking on the status of ultrasound

## 2017-11-14 NOTE — Telephone Encounter (Signed)
Spoke with [facility] - order needs to say U/S scrotum with doppler (that is their protocol) Order entered.

## 2017-11-16 NOTE — Telephone Encounter (Signed)
Noted. Thanks.

## 2018-10-28 ENCOUNTER — Encounter (HOSPITAL_COMMUNITY): Payer: Self-pay | Admitting: *Deleted

## 2018-10-28 ENCOUNTER — Emergency Department (HOSPITAL_COMMUNITY)
Admission: EM | Admit: 2018-10-28 | Discharge: 2018-10-28 | Disposition: A | Discharge status: Home / Self Care | Payer: Self-pay | Attending: Emergency Medicine | Admitting: Emergency Medicine

## 2018-10-28 ENCOUNTER — Other Ambulatory Visit: Payer: Self-pay

## 2018-10-28 DIAGNOSIS — F17210 Nicotine dependence, cigarettes, uncomplicated: Secondary | ICD-10-CM | POA: Insufficient documentation

## 2018-10-28 DIAGNOSIS — J449 Chronic obstructive pulmonary disease, unspecified: Secondary | ICD-10-CM | POA: Insufficient documentation

## 2018-10-28 DIAGNOSIS — L0291 Cutaneous abscess, unspecified: Secondary | ICD-10-CM

## 2018-10-28 DIAGNOSIS — L0201 Cutaneous abscess of face: Secondary | ICD-10-CM | POA: Insufficient documentation

## 2018-10-28 MED ORDER — DOXYCYCLINE HYCLATE 100 MG PO CAPS: 100.0000 mg | ORAL_CAPSULE | Freq: Two times a day (BID) | ORAL | 0 refills | Status: AC

## 2018-10-28 NOTE — ED Triage Notes (Signed)
To ED for eval of possible abscess under chin/neck area. States he has 'cysts' that show up on body but this 'is different'. This area started about 3 days ago. He's put hot compresses on it.

## 2018-10-28 NOTE — ED Provider Notes (Signed)
[facility] Provider Note   CSN: [id number] Arrival date & time: 10/28/18  [id number]    History   Chief Complaint Chief Complaint  Patient presents with  . Abscess    HPI John Gillespie is a 58 y.o. male.     HPI    58 year old male presents today with complaints of abscess.  Patient notes a developing abscess under his chin.  He notes this is been there for the last 2 days.  He denies any fever, difficulty swallowing, pain with range of motion of the neck.  He notes significant history of abscesses in different locations.  Been using warm compresses at home without improvement in symptoms.  No fever.      Past Medical History:  Diagnosis Date  . COPD (chronic obstructive pulmonary disease) (HCC)   . Smoker   . Unspecified essential hypertension 11/30/2012    Patient Active Problem List   Diagnosis Date Noted  . COPD (chronic obstructive pulmonary disease) (HCC) 11/30/2012  . Unspecified essential hypertension 11/30/2012  . Smoker 11/30/2012    History reviewed. No pertinent surgical history.      Home Medications    Prior to Admission medications   Medication Sig Start Date End Date Taking? Authorizing Provider  doxycycline (VIBRAMYCIN) 100 MG capsule Take 1 capsule (100 mg total) by mouth 2 (two) times daily. 10/28/18   Eyvonne MechanicHedges, Olof Marcil, PA-C    Family History Family History  Problem Relation Age of Onset  . COPD Other   . Cancer Other   . Cancer Father     Social History Social History   Tobacco Use  . Smoking status: Current Every Day Smoker    Packs/day: 1.00  . Smokeless tobacco: Never Used  Substance Use Topics  . Alcohol use: Yes  . Drug use: No    Comment: used in past      Allergies   Codeine   Review of Systems Review of Systems  All other systems reviewed and are negative.    Physical Exam Updated Vital Signs BP (!) 133/91 (BP Location: Right Arm)   Pulse 64   Temp 98.5 F (36.9 C)  (Oral)   Resp 16   SpO2 96%   Physical Exam Vitals signs and nursing note reviewed.  Constitutional:      Appearance: He is well-developed.  HENT:     Head: Normocephalic and atraumatic.  Eyes:     General: No scleral icterus.       Right eye: No discharge.        Left eye: No discharge.     Conjunctiva/sclera: Conjunctivae normal.     Pupils: Pupils are equal, round, and reactive to light.  Neck:     Musculoskeletal: Normal range of motion.     Vascular: No JVD.     Trachea: No tracheal deviation.     Comments: 2 cm area of induration noted to the submandibular skin,-this is mobile with no deep space involvement, surrounding soft tissue nontender neck supple full active range of motion, no central fluctuance, very small pustule noted, no surrounding erythema-voice normal Pulmonary:     Effort: Pulmonary effort is normal.     Breath sounds: No stridor.  Neurological:     Mental Status: He is alert and oriented to person, place, and time.     Coordination: Coordination normal.  Psychiatric:        Behavior: Behavior normal.        Thought Content: Thought  content normal.        Judgment: Judgment normal.      ED Treatments / Results  Labs (all labs ordered are listed, but only abnormal results are displayed) Labs Reviewed - No data to display  EKG None  Radiology No results found.  Procedures Procedures (including critical care time)  Medications Ordered in ED Medications - No data to display   Initial Impression / Assessment and Plan / ED Course  I have reviewed the triage vital signs and the nursing notes.  Pertinent labs & imaging results that were available during my care of the patient were reviewed by me and considered in my medical decision making (see chart for details).        Assessment/Plan: 58 year old male presents today with an abscess.  To use the ultrasound at bedside there was no collection amenable to I&D at this time.  No signs of deep  space involvement.  Patient will be placed on antibiotics he will return if symptoms persist or worsen.  Patient verbalized understanding and agreement to today's plan had no further questions or concerns at the time of discharge.      Final Clinical Impressions(s) / ED Diagnoses   Final diagnoses:  Abscess    ED Discharge Orders         Ordered    doxycycline (VIBRAMYCIN) 100 MG capsule  2 times daily     10/28/18 1216           HedgesTinnie Gens, PA-C 10/28/18 1231    Melene Plan, DO 10/28/18 [phone]

## 2018-10-28 NOTE — Discharge Instructions (Addendum)
Please read attached information. If you experience any new or worsening signs or symptoms please return to the emergency room for evaluation. Please follow-up with your primary care provider or specialist as discussed. Please use medication prescribed only as directed and discontinue taking if you have any concerning signs or symptoms.   °
